# Patient Record
Sex: Female | Born: 1935 | Race: White | Hispanic: No | Smoking: Never smoker
Health system: Southern US, Community
[De-identification: ages and names within clinical notes are randomized; demographics above are authoritative.]

## PROBLEM LIST (undated history)

## (undated) DIAGNOSIS — I219 Acute myocardial infarction, unspecified: Secondary | ICD-10-CM

## (undated) DIAGNOSIS — E7800 Pure hypercholesterolemia, unspecified: Secondary | ICD-10-CM

## (undated) DIAGNOSIS — I639 Cerebral infarction, unspecified: Secondary | ICD-10-CM

## (undated) DIAGNOSIS — F419 Anxiety disorder, unspecified: Secondary | ICD-10-CM

## (undated) HISTORY — PX: CORONARY ARTERY BYPASS GRAFT: SHX141

## (undated) HISTORY — PX: BRAIN SURGERY: SHX531

---

## 2010-12-29 ENCOUNTER — Ambulatory Visit: Payer: Self-pay | Admitting: Internal Medicine

## 2012-02-10 ENCOUNTER — Emergency Department: Payer: Self-pay | Admitting: Emergency Medicine

## 2012-02-10 LAB — COMPREHENSIVE METABOLIC PANEL
Albumin: 3.6 g/dL (ref 3.4–5.0)
Anion Gap: 12 (ref 7–16)
BUN: 15 mg/dL (ref 7–18)
Chloride: 99 mmol/L (ref 98–107)
Co2: 24 mmol/L (ref 21–32)
Osmolality: 271 (ref 275–301)
Potassium: 4.3 mmol/L (ref 3.5–5.1)
SGOT(AST): 25 U/L (ref 15–37)
Total Protein: 7 g/dL (ref 6.4–8.2)

## 2012-02-10 LAB — CBC
HCT: 35.4 % (ref 35.0–47.0)
MCV: 93 fL (ref 80–100)
Platelet: 237 10*3/uL (ref 150–440)
RBC: 3.82 10*6/uL (ref 3.80–5.20)
RDW: 14.1 % (ref 11.5–14.5)
WBC: 8.8 10*3/uL (ref 3.6–11.0)

## 2012-03-06 ENCOUNTER — Ambulatory Visit: Payer: Self-pay | Admitting: Internal Medicine

## 2013-03-16 ENCOUNTER — Ambulatory Visit: Payer: Self-pay | Admitting: Family Medicine

## 2013-03-16 LAB — RAPID INFLUENZA A&B ANTIGENS

## 2013-04-02 ENCOUNTER — Ambulatory Visit: Payer: Self-pay | Admitting: Internal Medicine

## 2013-11-04 ENCOUNTER — Inpatient Hospital Stay: Payer: Self-pay | Admitting: Internal Medicine

## 2013-11-04 LAB — CK TOTAL AND CKMB (NOT AT ARMC)
CK, Total: 95 U/L (ref 21–215)
CK-MB: 4.7 ng/mL — ABNORMAL HIGH (ref 0.5–3.6)

## 2013-11-04 LAB — TROPONIN I
Troponin-I: 1.9 ng/mL — ABNORMAL HIGH
Troponin-I: 1.8 ng/mL — ABNORMAL HIGH

## 2013-11-04 LAB — CBC
HCT: 34.6 % — ABNORMAL LOW (ref 35.0–47.0)
HGB: 11.5 g/dL — ABNORMAL LOW (ref 12.0–16.0)
MCH: 30.1 pg (ref 26.0–34.0)
MCV: 91 fL (ref 80–100)
WBC: 6.6 10*3/uL (ref 3.6–11.0)

## 2013-11-04 LAB — BASIC METABOLIC PANEL
Anion Gap: 4 — ABNORMAL LOW (ref 7–16)
BUN: 11 mg/dL (ref 7–18)
Chloride: 101 mmol/L (ref 98–107)
Co2: 27 mmol/L (ref 21–32)
Creatinine: 1 mg/dL (ref 0.60–1.30)
EGFR (African American): >60
Glucose: 136 mg/dL — ABNORMAL HIGH (ref 65–99)
Potassium: 4.2 mmol/L (ref 3.5–5.1)
Sodium: 132 mmol/L — ABNORMAL LOW (ref 136–145)

## 2013-11-04 LAB — PRO B NATRIURETIC PEPTIDE: B-Type Natriuretic Peptide: 5813 pg/mL — ABNORMAL HIGH (ref 0–450)

## 2013-11-04 LAB — CK-MB: CK-MB: 5.6 ng/mL — ABNORMAL HIGH (ref 0.5–3.6)

## 2013-11-05 LAB — CBC WITH DIFFERENTIAL/PLATELET
Basophil #: 0.1 10*3/uL (ref 0.0–0.1)
Basophil %: 0.6 %
Eosinophil #: 0.2 10*3/uL (ref 0.0–0.7)
Eosinophil %: 1.9 %
HCT: 34.8 % — ABNORMAL LOW (ref 35.0–47.0)
HGB: 11.8 g/dL — ABNORMAL LOW (ref 12.0–16.0)
Lymphocyte #: 1.9 10*3/uL (ref 1.0–3.6)
Lymphocyte %: 21.9 %
MCH: 30.5 pg (ref 26.0–34.0)
Monocyte #: 1.3 x10 3/mm — ABNORMAL HIGH (ref 0.2–0.9)
Monocyte %: 15.1 %
Neutrophil #: 5.3 10*3/uL (ref 1.4–6.5)
Neutrophil %: 60.5 %
RDW: 13.9 % (ref 11.5–14.5)
WBC: 8.8 10*3/uL (ref 3.6–11.0)

## 2013-11-05 LAB — LIPID PANEL
HDL Cholesterol: 46 mg/dL (ref 40–60)
Triglycerides: 77 mg/dL (ref 0–200)

## 2013-11-05 LAB — BASIC METABOLIC PANEL
BUN: 12 mg/dL (ref 7–18)
Calcium, Total: 9.1 mg/dL (ref 8.5–10.1)
Co2: 26 mmol/L (ref 21–32)
Glucose: 99 mg/dL (ref 65–99)
Osmolality: 270 (ref 275–301)
Potassium: 3.8 mmol/L (ref 3.5–5.1)
Sodium: 135 mmol/L — ABNORMAL LOW (ref 136–145)

## 2013-11-05 LAB — TROPONIN I: Troponin-I: 1.7 ng/mL — ABNORMAL HIGH

## 2013-11-05 LAB — CK TOTAL AND CKMB (NOT AT ARMC)
CK, Total: 95 U/L (ref 21–215)
CK-MB: 4.4 ng/mL — ABNORMAL HIGH (ref 0.5–3.6)

## 2013-12-16 DIAGNOSIS — I2510 Atherosclerotic heart disease of native coronary artery without angina pectoris: Secondary | ICD-10-CM

## 2013-12-16 DIAGNOSIS — I6990 Unspecified sequelae of unspecified cerebrovascular disease: Secondary | ICD-10-CM

## 2013-12-16 DIAGNOSIS — I4891 Unspecified atrial fibrillation: Secondary | ICD-10-CM

## 2014-04-28 ENCOUNTER — Ambulatory Visit: Payer: Self-pay | Admitting: Internal Medicine

## 2014-12-02 ENCOUNTER — Telehealth: Payer: Self-pay | Admitting: Internal Medicine

## 2014-12-02 NOTE — Telephone Encounter (Signed)
Theotis BurrowJohn McCann at [employer] is calling asking that you call him back about patient.  He needs to discuss your role in treatment of patient at [facility] and if you have a claim with a Malpractice Carrier. [place] has [employer] and he has received a claim from this patient's attorney.  Please call.

## 2014-12-02 NOTE — Telephone Encounter (Signed)
I was expecting his call as Sela HuaPam Fox alerted me to expect it. I left a message and will try to call him back tomorrow between 1 & 2

## 2014-12-03 NOTE — Telephone Encounter (Signed)
Message left again.

## 2014-12-03 NOTE — Telephone Encounter (Signed)
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 There is allegation of delay in noting worsening of the wound and then hearing loss from amikacin that was subsequently needed

## 2014-12-04 NOTE — Telephone Encounter (Signed)
Risk management wants to know prescribed the antibiotic that they claim caused hearing loss?  Was her surgery done at [facility]?  We don't see this in Epic.

## 2014-12-05 NOTE — Telephone Encounter (Signed)
It was at [facility] and she was briefly at [facility]. They will need to review records as I don't recall all the specifics. I definitely never prescribed the amikacin that apparently was associated with the hearing loss as we never gave parenteral antibiotics at [facility]

## 2015-03-12 NOTE — Discharge Summary (Signed)
PATIENT NAME:  Holly York, Holly York MR#:  [id number] DATE OF BIRTH:  11-16-1936  DATE OF ADMISSION:  11/04/2013 DATE OF DISCHARGE:  11/05/2013  DISCHARGE DIAGNOSES: 1.  Non-ST-elevation myocardial infarction, multiple blockage as per cardiac cath, need for urgent coronary artery bypass graft.  2.  Acute systolic congestive heart failure, ejection fraction 30%.  3.  Hypertension.  4.  Hyperlipidemia.  5.  History of cerebral aneurysm and rupture causing bleed.  Last was in 2010.   CONDITION ON DISCHARGE:  Guarded.    CODE STATUS:  FULL CODE.   MEDICATIONS ON DISCHARGE: 1.  Citalopram 20 mg once a day.  2.  Simvastatin 40 mg once a day.  3.  Aspirin 81 mg once a day.  4.  Nitroglycerin 0.4 mg sublingual tablet as needed for chest pain.  5.  Furosemide 10 mg injectable every 12 hours.  6.  Pantoprazole 40 mg delayed-release once a day.   DIET ON DISCHARGE:  Low sodium, low fat, low cholesterol.  Diet consistency regular.   ACTIVITY:  Advise as no exertional activity.   COMMENTS:  The patient was being transferred to [facility] for urgent CABG, follow instructions as per cardiology at [facility] on discharge.  Advised to have follow-up with Dr. Adrian Blackwater.   HISTORY OF PRESENT ILLNESS:  The patient is a 79 year old female with past history of hypertension and chronic dry cough and hyperlipidemia, brain aneurysm and cerebral bleeding secondary to that with surgery twice in the past with some worsening of the cough for the last 2 to 3 days which was dry.  Also feeling some short of breath.  Because of constant coughing started feeling some chest pain in her ribs.  It was both sides and so she decided to come to Emergency Room.  Denied any palpitations or syncopal episode.  On further work-up she was found having elevated troponin and so admitted for further management.   HOSPITAL COURSE AND STAY:  Non-ST-elevation MI.  Because she had a history of cerebral bleed, she was not started on a heparin drip or  strong blood thinner, but just continued on aspirin and regular cardiac medication with telemetry monitoring and further follow-up of troponins.  Troponin remained high on further follow-up also and so cardiac cath was done the next day by Dr. Welton Flakes which showed multiple blockages and so he suggested to transfer the patient to [facility] for urgent coronary artery bypass graft surgery.   OTHER MEDICAL ISSUES: 1.  Acute systolic heart failure.  The patient's ejection fraction found to be 30% to 35%.  She was on IV Lasix and was getting comfortable.  2.  History of cerebral aneurysm bleed in the past so we did not give any strong anticoagulation.    CONSULTATIONS IN THE HOSPITAL:  Cardiology consult with Dr. Adrian Blackwater.   IMPORTANT LABORATORY RESULTS:  Chest x-ray, portable, single view  congestive heart failure.  Troponin was 1.9 on admission.  Creatinine was 1.  BNP was 5813.  D-dimer was 0.88.  CT of the angiogram pulmonary artery was done which showed extensive atherosclerosis, calcification aorta and coronary artery.  Troponin stayed stable 1.8 on further follow-up, 1.7 on third time follow-up.  Echocardiogram done which showed ejection fraction 30% to 35%, mild dilated left and right atrium, mild to moderate mitral valve regurgitation, mild aortic regurgitation.  Cardiac catheterization revealed ejection fraction 35%, critical left main with critical ostial disease in the LAD, left circumflex and right intermediate, 70% mid RCA and  apical hypokinesis,  proximal LAD was 99% stenosis.  Plan was transfer [facility] for urgent CABG.    Total time spent on this discharge 40 minutes.    ____________________________ Hope PigeonVaibhavkumar G. Elisabeth PigeonVachhani, MD vgv:ea D: 11/07/2013 22:52:07 ET T: 11/08/2013 07:02:18 ET JOB#: [id number]  cc: Hope PigeonVaibhavkumar G. Elisabeth PigeonVachhani, MD, <Dictator> Altamese DillingVAIBHAVKUMAR Quantina Dershem MD ELECTRONICALLY SIGNED 11/10/2013 14:31

## 2015-03-12 NOTE — Consult Note (Signed)
PATIENT NAME:  Holly York, Holly York MR#:  [id number] DATE OF BIRTH:  1936-05-05  DATE OF CONSULTATION:  11/05/2013  REFERRING PHYSICIAN:  Dr. Elisabeth PigeonVachhani  CONSULTING PHYSICIAN:  Verta EllenMonica A. Aurel Nguyen, PA-C and Adrian BlackwaterShaukat Khan, MD  PRIMARY CARE PHYSICIAN:  [facility].   REASON FOR CONSULTATION: Chest pain, elevated troponin.   HISTORY OF PRESENT ILLNESS: Ms. Holly York  is a 79 year old white female with a past medical history significant for hypertension, hyperlipidemia and cerebral artery aneurysm. The patient notes that yesterday she had increased shortness of breath, orthopnea, coughing and chest pain, which she thought was secondary to her cough. The patient normally only has to use two pillows to breathe, but yesterday she had to sleep sitting straight up. She does not have any chest pain currently and her history and physical is done while she is in echocardiogram room today.   PAST MEDICAL HISTORY: 1.  Hypertension.  2.  Hyperlipidemia.  3.  Cerebral artery aneurysm with cerebral bleeding x 2, once in 1988, second in 2010 status post surgery.   PAST SURGICAL HISTORY: Cerebral aneurysm surgery as mentioned above.   ALLERGIES: No known drug allergies.   HOME MEDICATIONS: 1.  Vitamin D3 one tablet p.o. daily. 2.  Vitamin B6 one tablet p.o. daily.  3.  Vitamin B12 once daily.  4.  Simvastatin 40 mg p.o. daily.  5.  Multivitamin 1 tablet p.o. daily.  6.  Fosamax 70 mg once weekly.  7.  Folic acid once daily.  8.  Citalopram 20 mg once daily.  9.  Carvedilol 12.5 mg 1 tablet p.o. b.i.d.   SOCIAL HISTORY: The patient lives with her family. She used to smoke but quit over 30 years ago. She drinks 1 or 2 beers every evening. She denies any illicit drug use.   FAMILY HISTORY:  Negative for coronary artery disease.   REVIEW OF SYSTEMS:  GENERAL: The patient denies any fever weight loss, weakness.  EYES: The patient denies any blurry vision, double vision.  EARS, NOSE AND  THROAT: The patient denies any tinnitus, ear pain, hearing loss.  RESPIRATORY:  The patient complains of increased shortness of breath and nonproductive cough.  CARDIOVASCULAR: The patient complains of chest pain after she started coughing. She has had increased orthopnea. Denies any palpitations or edema.  GASTROINTESTINAL: The patient denies any abdominal pain, nausea or vomiting.   PHYSICAL EXAMINATION: GENERAL: This is a pleasant female who is not in any acute distress. She is alert and oriented x 3.  VITAL SIGNS: Temperature 97.8 degrees Fahrenheit, heart rate is 72, respiratory rate 18, blood pressure 94/57, O2 sat is 91% on room air.  HEENT: Head atraumatic, normocephalic.  EYES: Pupils are round and equal. Conjunctivae are pink.  EARS AND NOSE: Normal to external inspection.  MOUTH: Moist mucous membranes.  NECK: Supple. Trachea is midline. There are no carotid bruits. There is no JVD.  LUNGS: The patient has some diminished breath sounds in the right lower lung base. There is no accessory muscle use.  CARDIOVASCULAR: Regular rate and rhythm. No murmurs, rubs, or gallops can be appreciated.  ABDOMEN: Nondistended. It is soft, nontender to palpation with bowel sounds present in all 4  quadrants.  EXTREMITIES: The patient has trace pedal edema bilaterally.   ANCILLARY DATA: EKG with normal sinus rhythm with nonspecific ST changes anteroseptally. Chest x-ray: Persistent findings of pulmonary edema and bibasilar atelectasis superimposed on advanced emphysematous change,  compression deformities involving T9 and L1  CT ANGIOGRAPHY: No evidence  of PE. Extensive arthrosclerotic disease. COPD changes with bibasilar small effusions and atelectasis.   LABORATORY DATA: Glucose 99, BUN 12, creatinine 1.04, sodium 135, potassium 3.8, chloride 101. Estimated GFR is greater than 60. LDL 63, VLDL 15, total cholesterol 409, triglycerides 77, HDL 46. Total CK 95, CK-MB 5.6 followed by 4.7, followed by  4.4. Troponin I 1.9, followed by 1.8, followed by 1.70. White blood cell count 8.8, hemoglobin 11.8, hematocrit 34.8, platelet count 214,000.   ASSESSMENT/PLAN: 1.  Chest pain and shortness of breath with elevated troponin I most likely consistent with non-ST elevation myocardial infarction. The patient denies any chest pain or shortness of breath at this time and we agree with continuing medical therapy with PPIs, with Zocor, aspirin and Lasix. Currently beta blockers are on hold for hypotension and heparin and has been held due to a history of a cerebral aneurysm. Echocardiogram has been ordered and pending results, we will proceed with cardiac catheterization later today with Dr. Adrian Blackwater. The risks and benefits of the procedure were discussed with the patient in detail and she is willing to proceed.   We will continue to follow this patient with you. Thank you very much for this consultation and allowing [place] to participate in this patient's care.    ____________________________ Verta Ellen, PA-C mam:dp D: 11/05/2013 09:18:48 ET T: 11/05/2013 09:57:39 ET JOB#: [id number]  cc: Verta Ellen, PA-C, <Dictator> [facility] Alivia Cimino A [facility] PA ELECTRONICALLY SIGNED 11/05/2013 21:37

## 2015-03-12 NOTE — H&P (Signed)
PATIENT NAME:  Holly York, Holly York MR#:  [id number] DATE OF BIRTH:  10-Jun-1936  DATE OF ADMISSION:  11/04/2013  PRIMARY CARE PHYSIC[facility] At [facility].  REFERRING EMERGENCY ROOM PHYSICIAN: Dr. Doug SouSam Jacubowitz.  CHIEF COMPLAINT: Chest pain and shortness of breath.   HISTORY OF PRESENT ILLNESS: The patient is a 79 year old female with past history of hypertension and chronic dry cough and hyperlipidemia with brain aneurysm and cerebral bleeding secondary to that and surgeries twice in the past. Had some worsening of her cough for the last 2 to 3 days, which is dry, and she is also feeling short of breath with that sometimes. Because of the constant coughing she started feeling chest pain, which is more on her ribs, lower side both sides, and so finally decided to come to the Emergency Room today. She said that today the pain is constant. On further questioning, she denies any palpitation or syncopal episodes. She denies any swelling in her legs. Denies any fever or sputum production. Her daughter had some bronchitis symptoms last week which resolved.  REVIEW OF SYSTEMS:    CONSTITUTIONAL: Negative for fever, fatigue, weakness, pain, or weight loss.  EYES: No blurring, double vision, discharge, or redness.  EARS, NOSE, THROAT: No tinnitus, ear pain, or hearing loss.  RESPIRATORY: The patient had some cough and shortness of breath, but no wheezing or sputum.  CARDIOVASCULAR: Has some chest pain secondary to constant cough. No orthopnea, edema, or palpitations.  GASTROINTESTINAL: No nausea, vomiting, diarrhea, abdominal pain.  GENITOURINARY: No dysuria, hematuria, or increased frequency.  ENDOCRINE: No increased sweating. No heat or cold intolerance.  SKIN: No acne, rashes, or lesions on the skin.  MUSCULOSKELETAL: No pain or swelling in the joints.  NEUROLOGICAL: No numbness, weakness, or change in the status. No tremor or vertigo.  PSYCHIATRIC: No anxiety, insomnia, bipolar disorder.    PAST MEDICAL HISTORY: Hypertension, hyperlipidemia, and cerebral artery aneurysms. Had ruptured aneurysm and cerebral bleeding twice, one in 1988 and [facility] [place], and the second one was in 2010 followed by surgery and then after the surgery, there was repeat angiogram done to check for the cerebral artery aneurysm which did not show any aneurysm in there.  PAST SURGICAL HISTORY: As mentioned above, cerebral aneurysm surgeries.   SOCIAL HISTORY: Lives with family. Nonsmoker since the last 30 years. Before that she was smoking. Drinks beer one time every evening. No illegal drug use.   FAMILY HISTORY: Negative for any significant medical problems.  MEDICATIONS AT HOME:   1.  Vitamin D3, 1 tablet once a day.  2.  Vitamin B6 once a day.  3.  Vitamin B12 once a day.  4.  Simvastatin 40 mg oral once a day.  5.  Multivitamin once a day.  6.  Fosamax 70 mg oral tablet once a week.  7.  Folic acid once a day.  8.  Citalopram 20 mg once a day.  9.  Carvedilol 12.5 mg 2 times a day.  PHYSICAL EXAMINATION: VITAL SIGNS: In ER, temperature 97.5, pulse 77, respirations 20, blood pressure 97/54, and pulse oximetry 95 on room air  GENERAL: The patient is fully alert and oriented to time, place, and person. Does not appear in any acute distress.  HEENT: Head and neck atraumatic. Conjunctivae pink. Oral mucosa moist.  NECK: Supple. No JVD.  RESPIRATORY: Bilateral clear and equal air entry.  CARDIOVASCULAR: S1, S2 present, regular. No murmur.  ABDOMEN: Soft, nontender. Bowel sounds present. No  organomegaly.  SKIN: No rashes.  EXTREMITIES: Legs: No edema.  NEUROLOGICAL: Some right upper limb weakness but otherwise, power 5 out of 5 in all other 3 limbs. Follows commands. No tremor or rigidity.  PSYCHIATRIC: Does not appear in any acute psychiatric illness.   IMPORTANT LABORATORY AND RADIOLOGICAL RESULTS: Glucose 136, BNP 5813, BUN 11, creatinine 1, sodium 132,  potassium 4.2, chloride 101, CO2 of 27, calcium 9.2. Troponin 1.90. WBC 6.6, hemoglobin 11.5, platelets 209. INR is 1. D-dimer is 0.88. CT scan of the chest with contrast for angiogram is done. It showed no evidence of pulmonary embolism, extensive atherosclerotic disease, COPD changes with bibasilar small effusion and atelectasis.   ASSESSMENT AND PLAN: A 79 year old female with history of hypertension, hyperlipidemia, and brain aneurysms and blockages and surgeries in the past, came with complaint of chest pain and shortness of breath with dry cough for the last 2 days. Pain is getting worse. Found having troponin 1.9, and CT scan shows diffuse atherosclerosis.  1.  Non-ST-elevation myocardial infarction: I spoke to Dr. Lady Gary about her being high risk for cerebral bleed and aneurysm history in the past, and he agreed not to start on any strong anticoagulation at this time, just give aspirin to her and give her cardiac medication as permitted including statin, beta blockers, and Lasix. She is not a candidate for beta blocker at this time because of blood pressure running slightly on the lower side, and I have to give Lasix because of her shortness of breath with elevated BNP, so I will avoid beta blocker at this time, give aspirin and statin. Will monitor on telemetry and follow serial troponins.  2.  Hypertension: Currently we will hold all the medication because we need to give Lasix, and blood pressure is running slightly on the lower normal side.  3.  Shortness of breath, elevated BNP: Congestive heart failure exacerbation or it is cardiomyopathy secondary to ischemia. We have to do echocardiogram to get further evaluation of this issue and cardiology consult with Dr. Lady Gary. We will give IV Lasix at this time.  4.  History of cerebral aneurysm and bleed in the past: She is high risk for that and so will avoid any strong anticoagulation at this time, including heparin or Lovenox, and will just monitor her  troponin. Dr. Lady Gary to decide about further planning from cardiac point of view tomorrow.   CODE STATUS: Full code.   TOTAL TIME SPENT: 50 minutes in admission of this patient.  ____________________________ Hope Pigeon. Elisabeth Pigeon, MD vgv:jcm D: 11/04/2013 20:26:42 ET T: 11/04/2013 21:53:22 ET JOB#: [id number]  cc: Hope Pigeon. Elisabeth Pigeon, MD, <Dictator> Altamese Dilling MD ELECTRONICALLY SIGNED 11/10/2013 14:26

## 2015-05-06 ENCOUNTER — Other Ambulatory Visit: Payer: Self-pay | Admitting: Internal Medicine

## 2015-05-06 DIAGNOSIS — Z1231 Encounter for screening mammogram for malignant neoplasm of breast: Secondary | ICD-10-CM

## 2015-05-28 ENCOUNTER — Emergency Department: Payer: Medicare Other

## 2015-05-28 ENCOUNTER — Observation Stay
Admission: EM | Admit: 2015-05-28 | Discharge: 2015-05-31 | Disposition: A | Discharge status: Home / Self Care | Payer: Medicare Other | Attending: Internal Medicine | Admitting: Internal Medicine

## 2015-05-28 ENCOUNTER — Encounter: Payer: Self-pay | Admitting: Emergency Medicine

## 2015-05-28 DIAGNOSIS — Z79899 Other long term (current) drug therapy: Secondary | ICD-10-CM | POA: Diagnosis not present

## 2015-05-28 DIAGNOSIS — E780 Pure hypercholesterolemia: Secondary | ICD-10-CM | POA: Insufficient documentation

## 2015-05-28 DIAGNOSIS — F419 Anxiety disorder, unspecified: Secondary | ICD-10-CM | POA: Insufficient documentation

## 2015-05-28 DIAGNOSIS — I252 Old myocardial infarction: Secondary | ICD-10-CM | POA: Insufficient documentation

## 2015-05-28 DIAGNOSIS — Z8673 Personal history of transient ischemic attack (TIA), and cerebral infarction without residual deficits: Secondary | ICD-10-CM | POA: Insufficient documentation

## 2015-05-28 DIAGNOSIS — E785 Hyperlipidemia, unspecified: Secondary | ICD-10-CM | POA: Insufficient documentation

## 2015-05-28 DIAGNOSIS — I2510 Atherosclerotic heart disease of native coronary artery without angina pectoris: Secondary | ICD-10-CM | POA: Diagnosis present

## 2015-05-28 DIAGNOSIS — S7002XA Contusion of left hip, initial encounter: Secondary | ICD-10-CM

## 2015-05-28 DIAGNOSIS — Z811 Family history of alcohol abuse and dependence: Secondary | ICD-10-CM | POA: Insufficient documentation

## 2015-05-28 DIAGNOSIS — D72829 Elevated white blood cell count, unspecified: Secondary | ICD-10-CM | POA: Diagnosis not present

## 2015-05-28 DIAGNOSIS — M25552 Pain in left hip: Secondary | ICD-10-CM | POA: Diagnosis not present

## 2015-05-28 DIAGNOSIS — Z951 Presence of aortocoronary bypass graft: Secondary | ICD-10-CM | POA: Insufficient documentation

## 2015-05-28 DIAGNOSIS — Z7982 Long term (current) use of aspirin: Secondary | ICD-10-CM | POA: Insufficient documentation

## 2015-05-28 DIAGNOSIS — Y92009 Unspecified place in unspecified non-institutional (private) residence as the place of occurrence of the external cause: Secondary | ICD-10-CM

## 2015-05-28 DIAGNOSIS — Z9889 Other specified postprocedural states: Secondary | ICD-10-CM | POA: Insufficient documentation

## 2015-05-28 DIAGNOSIS — Z8249 Family history of ischemic heart disease and other diseases of the circulatory system: Secondary | ICD-10-CM | POA: Diagnosis not present

## 2015-05-28 DIAGNOSIS — W19XXXA Unspecified fall, initial encounter: Secondary | ICD-10-CM

## 2015-05-28 HISTORY — DX: Cerebral infarction, unspecified: I63.9

## 2015-05-28 HISTORY — DX: Pure hypercholesterolemia, unspecified: E78.00

## 2015-05-28 HISTORY — DX: Acute myocardial infarction, unspecified: I21.9

## 2015-05-28 HISTORY — DX: Anxiety disorder, unspecified: F41.9

## 2015-05-28 LAB — COMPREHENSIVE METABOLIC PANEL
ALK PHOS: 57 U/L (ref 38–126)
ALT: 19 U/L (ref 14–54)
AST: 30 U/L (ref 15–41)
Albumin: 3.9 g/dL (ref 3.5–5.0)
Anion gap: 9 (ref 5–15)
BILIRUBIN TOTAL: 0.6 mg/dL (ref 0.3–1.2)
BUN: 19 mg/dL (ref 6–20)
CHLORIDE: 102 mmol/L (ref 101–111)
CO2: 25 mmol/L (ref 22–32)
CREATININE: 1.09 mg/dL — AB (ref 0.44–1.00)
Calcium: 9.1 mg/dL (ref 8.9–10.3)
GFR calc Af Amer: 55 mL/min — ABNORMAL LOW (ref >60)
GFR, EST NON AFRICAN AMERICAN: 48 mL/min — AB (ref >60)
GLUCOSE: 114 mg/dL — AB (ref 65–99)
POTASSIUM: 3.7 mmol/L (ref 3.5–5.1)
Sodium: 136 mmol/L (ref 135–145)
Total Protein: 7 g/dL (ref 6.5–8.1)

## 2015-05-28 LAB — CBC WITH DIFFERENTIAL/PLATELET
Basophils Absolute: 0 10*3/uL (ref 0–0.1)
Basophils Relative: 0 %
Eosinophils Absolute: 0.1 10*3/uL (ref 0–0.7)
Eosinophils Relative: 1 %
HEMATOCRIT: 36.9 % (ref 35.0–47.0)
Hemoglobin: 12.1 g/dL (ref 12.0–16.0)
LYMPHS PCT: 13 %
Lymphs Abs: 2 10*3/uL (ref 1.0–3.6)
MCH: 29.8 pg (ref 26.0–34.0)
MCHC: 32.8 g/dL (ref 32.0–36.0)
MCV: 90.9 fL (ref 80.0–100.0)
MONO ABS: 1.2 10*3/uL — AB (ref 0.2–0.9)
MONOS PCT: 7 %
NEUTROS ABS: 12.6 10*3/uL — AB (ref 1.4–6.5)
Neutrophils Relative %: 79 %
Platelets: 208 10*3/uL (ref 150–440)
RBC: 4.05 MIL/uL (ref 3.80–5.20)
RDW: 14.3 % (ref 11.5–14.5)
WBC: 16 10*3/uL — AB (ref 3.6–11.0)

## 2015-05-28 LAB — TYPE AND SCREEN
ABO/RH(D): B POS
Antibody Screen: NEGATIVE

## 2015-05-28 LAB — PROTIME-INR
INR: 1.01
PROTHROMBIN TIME: 13.5 s (ref 11.4–15.0)

## 2015-05-28 MED ORDER — ONDANSETRON HCL 4 MG/2ML IJ SOLN
INTRAMUSCULAR | Status: AC
  Filled 2015-05-28: qty 2

## 2015-05-28 MED ORDER — ONDANSETRON HCL 4 MG/2ML IJ SOLN
4.0000 mg | Freq: Once | INTRAMUSCULAR | Status: AC
  Administered 2015-05-28: 4 mg via INTRAVENOUS

## 2015-05-28 MED ORDER — MORPHINE SULFATE 4 MG/ML IJ SOLN
INTRAMUSCULAR | Status: AC
  Filled 2015-05-28: qty 1

## 2015-05-28 MED ORDER — MORPHINE SULFATE 4 MG/ML IJ SOLN
4.0000 mg | INTRAMUSCULAR | Status: DC | PRN
  Administered 2015-05-28: 4 mg via INTRAVENOUS

## 2015-05-28 NOTE — ED Notes (Signed)
Pt here by POV with c/o left hip pain; family reports pt fell today on left hip around 1800, family denies pt with any LOC. Pt unable to bear weight on left hip.

## 2015-05-28 NOTE — ED Notes (Signed)
Pt reports improved pain after morphine administration.

## 2015-05-28 NOTE — ED Provider Notes (Signed)
[facility] Emergency Department Provider Note  ____________________________________________  Time seen: On arrival  I have reviewed the triage vital signs and the nursing notes.   HISTORY  Chief Complaint Hip Pain and Fall   HPI Holly York is a 79 y.o. female who presents with left hip pain. Patient reports she fell approximately 6 PM today because she lost her balance and landed on her left hip. Apparently she was initially able to stand but the pain has become worse and she is no longer able to ambulate. She denies dizziness she denies chest pain. No fevers no chills. He denies other injuries     Past Medical History  Diagnosis Date  . Heart attack   . Stroke   . Hypercholesterolemia   . Anxiety     There are no active problems to display for this patient.   Past Surgical History  Procedure Laterality Date  . Coronary artery bypass graft    . Brain surgery      Current Outpatient Rx  Name  Route  Sig  Dispense  Refill  . acetaminophen (TYLENOL) 325 MG tablet   Oral   Take 650 mg by mouth every 4 (four) hours as needed for mild pain.         Marland Kitchen. aspirin 81 MG tablet   Oral   Take 81 mg by mouth daily.         . Cholecalciferol (VITAMIN D3) 5000 UNITS CAPS   Oral   Take by mouth daily.         . citalopram (CELEXA) 20 MG tablet   Oral   Take 20 mg by mouth daily.         . cyanocobalamin 1000 MCG tablet   Oral   Take 100 mcg by mouth daily.         . folic acid (FOLVITE) 800 MCG tablet   Oral   Take 400 mcg by mouth daily.         . Multiple Vitamins-Minerals (MULTIVITAMIN WITH MINERALS) tablet   Oral   Take 1 tablet by mouth daily.         Marland Kitchen. nystatin (MYCOSTATIN/NYSTOP) 100000 UNIT/GM POWD   Topical   Apply topically 2 (two) times daily.         Marland Kitchen. pyridOXINE (VITAMIN B-6) 100 MG tablet   Oral   Take 100 mg by mouth daily.         . simvastatin (ZOCOR) 40 MG tablet   Oral   Take 40 mg by  mouth daily.           Allergies Review of patient's allergies indicates no known allergies.  No family history on file.  Social History History  Substance Use Topics  . Smoking status: Never Smoker   . Smokeless tobacco: Not on file  . Alcohol Use: Not on file    Review of Systems  Constitutional: Negative for fever. Eyes: Negative for visual changes. ENT: Negative for sore throat Cardiovascular: Negative for chest pain. Respiratory: Negative for shortness of breath. Gastrointestinal: Negative for abdominal pain, vomiting and diarrhea. Genitourinary: Negative for dysuria. Musculoskeletal: Negative for back pain. Positive for left hip pain Skin: Negative for rash. Neurological: Negative for headaches or focal weakness Psychiatric: Positive for anxiety  10-point ROS otherwise negative.  ____________________________________________   PHYSICAL EXAM:  VITAL SIGNS: ED Triage Vitals  Enc Vitals Group     BP 05/28/15 2055 103/74 mmHg     Pulse Rate 05/28/15  2055 79     Resp 05/28/15 2055 22     Temp 05/28/15 2055 97.7 F (36.5 C)     Temp Source 05/28/15 2055 Oral     SpO2 05/28/15 2055 94 %     Weight 05/28/15 2055 172 lb (78.019 kg)     Height --      Head Cir --      Peak Flow --      Pain Score 05/28/15 2056 7     Pain Loc --      Pain Edu? --      Excl. in GC? --      Constitutional: Alert and oriented. Well appearing but in pain Eyes: Conjunctivae are normal.  ENT   Head: Normocephalic and atraumatic.   Mouth/Throat: Mucous membranes are moist. Cardiovascular: Normal rate, regular rhythm. Normal and symmetric distal pulses are present in all extremities. No murmurs, rubs, or gallops. Respiratory: Normal respiratory effort without tachypnea nor retractions. Breath sounds are clear and equal bilaterally.  Gastrointestinal: Soft and non-tender in all quadrants. No distention. There is no CVA tenderness. Genitourinary: deferred Musculoskeletal:  Patient has left hip flexed and is unwilling to straighten it, tenderness to palpation of the left hip. Distal pulses intact Neurologic:  Normal speech and language. No gross focal neurologic deficits are appreciated. Skin:  Skin is warm, dry and intact. No rash noted. Psychiatric: Mood and affect are normal. Patient exhibits appropriate insight and judgment.  ____________________________________________    LABS (pertinent positives/negatives)  Labs Reviewed  CBC WITH DIFFERENTIAL/PLATELET - Abnormal; Notable for the following:    WBC 16.0 (*)    Neutro Abs 12.6 (*)    Monocytes Absolute 1.2 (*)    All other components within normal limits  COMPREHENSIVE METABOLIC PANEL - Abnormal; Notable for the following:    Glucose, Bld 114 (*)    Creatinine, Ser 1.09 (*)    GFR calc non Af Amer 48 (*)    GFR calc Af Amer 55 (*)    All other components within normal limits  PROTIME-INR  URINALYSIS COMPLETEWITH MICROSCOPIC ([facility] ONLY)  TYPE AND SCREEN  ABO/RH    ____________________________________________   EKG  None  ____________________________________________    RADIOLOGY I have personally reviewed any xrays that were ordered on this patient: Hip x-rays: No fracture seen  ____________________________________________   PROCEDURES  Procedure(s) performed: none  Critical Care performed: none  ____________________________________________   INITIAL IMPRESSION / ASSESSMENT AND PLAN / ED COURSE  Pertinent labs & imaging results that were available during my care of the patient were reviewed by me and considered in my medical decision making (see chart for details).  Presentation is certainly concerning for hip fracture. We will give morphine 4 g IV and Zofran 4 mg IV and obtain x-rays  ----------------------------------------- 11:09 PM on 05/28/2015 -----------------------------------------  X-ray reassuring as no fracture seen. The patient is not able to ambulate  however it is not safe for discharge I will consult the hospitalist for admission.  ____________________________________________   FINAL CLINICAL IMPRESSION(S) / ED DIAGNOSES  Final diagnoses:  Contusion, hip, left, initial encounter  Fall as cause of accidental injury in home as place of occurrence     Jene Every, MD 05/28/15 2315

## 2015-05-29 ENCOUNTER — Encounter: Payer: Self-pay | Admitting: Internal Medicine

## 2015-05-29 DIAGNOSIS — M25552 Pain in left hip: Secondary | ICD-10-CM | POA: Diagnosis present

## 2015-05-29 DIAGNOSIS — I2510 Atherosclerotic heart disease of native coronary artery without angina pectoris: Secondary | ICD-10-CM | POA: Diagnosis present

## 2015-05-29 DIAGNOSIS — W19XXXA Unspecified fall, initial encounter: Secondary | ICD-10-CM

## 2015-05-29 DIAGNOSIS — Y92009 Unspecified place in unspecified non-institutional (private) residence as the place of occurrence of the external cause: Secondary | ICD-10-CM

## 2015-05-29 LAB — URINALYSIS COMPLETE WITH MICROSCOPIC (ARMC ONLY)
BILIRUBIN URINE: NEGATIVE
GLUCOSE, UA: NEGATIVE mg/dL
Hgb urine dipstick: NEGATIVE
Ketones, ur: NEGATIVE mg/dL
Nitrite: NEGATIVE
Protein, ur: NEGATIVE mg/dL
Specific Gravity, Urine: 1.004 — ABNORMAL LOW (ref 1.005–1.030)
pH: 7 (ref 5.0–8.0)

## 2015-05-29 LAB — CBC
HCT: 37.4 % (ref 35.0–47.0)
HEMOGLOBIN: 12.6 g/dL (ref 12.0–16.0)
MCH: 30.8 pg (ref 26.0–34.0)
MCHC: 33.7 g/dL (ref 32.0–36.0)
MCV: 91.2 fL (ref 80.0–100.0)
Platelets: 199 10*3/uL (ref 150–440)
RBC: 4.1 MIL/uL (ref 3.80–5.20)
RDW: 14.4 % (ref 11.5–14.5)
WBC: 10.1 10*3/uL (ref 3.6–11.0)

## 2015-05-29 LAB — ABO/RH: ABO/RH(D): B POS

## 2015-05-29 MED ORDER — SODIUM CHLORIDE 0.9 % IJ SOLN
3.0000 mL | Freq: Two times a day (BID) | INTRAMUSCULAR | Status: DC
  Administered 2015-05-29 – 2015-05-31 (×5): 3 mL via INTRAVENOUS

## 2015-05-29 MED ORDER — ADULT MULTIVITAMIN W/MINERALS CH
1.0000 | ORAL_TABLET | Freq: Every day | ORAL | Status: DC
  Administered 2015-05-29 – 2015-05-31 (×3): 1 via ORAL
  Filled 2015-05-29 (×5): qty 1

## 2015-05-29 MED ORDER — MORPHINE SULFATE 2 MG/ML IJ SOLN
2.0000 mg | INTRAMUSCULAR | Status: DC | PRN
  Administered 2015-05-29 – 2015-05-30 (×2): 2 mg via INTRAVENOUS
  Filled 2015-05-29 (×2): qty 1

## 2015-05-29 MED ORDER — VITAMIN B-6 50 MG PO TABS
100.0000 mg | ORAL_TABLET | Freq: Every day | ORAL | Status: DC
  Administered 2015-05-29 – 2015-05-31 (×3): 100 mg via ORAL
  Filled 2015-05-29 (×3): qty 1

## 2015-05-29 MED ORDER — HYDROCODONE-ACETAMINOPHEN 5-325 MG PO TABS
1.0000 | ORAL_TABLET | ORAL | Status: DC | PRN
  Administered 2015-05-29: 2 via ORAL
  Administered 2015-05-29 – 2015-05-30 (×2): 1 via ORAL
  Administered 2015-05-30 (×2): 2 via ORAL
  Administered 2015-05-31 (×3): 1 via ORAL
  Filled 2015-05-29: qty 2
  Filled 2015-05-29: qty 1
  Filled 2015-05-29: qty 2
  Filled 2015-05-29: qty 1
  Filled 2015-05-29: qty 2
  Filled 2015-05-29 (×3): qty 1

## 2015-05-29 MED ORDER — SODIUM CHLORIDE 0.9 % IJ SOLN
3.0000 mL | INTRAMUSCULAR | Status: DC | PRN
  Administered 2015-05-29: 3 mL via INTRAVENOUS
  Filled 2015-05-29: qty 10

## 2015-05-29 MED ORDER — VITAMIN B-12 100 MCG PO TABS
100.0000 ug | ORAL_TABLET | Freq: Every day | ORAL | Status: DC
  Administered 2015-05-29 – 2015-05-31 (×3): 100 ug via ORAL
  Filled 2015-05-29 (×3): qty 1

## 2015-05-29 MED ORDER — ENOXAPARIN SODIUM 40 MG/0.4ML ~~LOC~~ SOLN
40.0000 mg | SUBCUTANEOUS | Status: DC
  Administered 2015-05-29 – 2015-05-31 (×3): 40 mg via SUBCUTANEOUS
  Filled 2015-05-29 (×3): qty 0.4

## 2015-05-29 MED ORDER — SENNOSIDES-DOCUSATE SODIUM 8.6-50 MG PO TABS
1.0000 | ORAL_TABLET | Freq: Two times a day (BID) | ORAL | Status: DC
  Administered 2015-05-29 – 2015-05-31 (×5): 1 via ORAL
  Filled 2015-05-29 (×5): qty 1

## 2015-05-29 MED ORDER — CITALOPRAM HYDROBROMIDE 20 MG PO TABS
20.0000 mg | ORAL_TABLET | Freq: Every day | ORAL | Status: DC
  Administered 2015-05-29 – 2015-05-31 (×3): 20 mg via ORAL
  Filled 2015-05-29 (×3): qty 1

## 2015-05-29 MED ORDER — ASPIRIN EC 81 MG PO TBEC
81.0000 mg | DELAYED_RELEASE_TABLET | Freq: Every day | ORAL | Status: DC
  Administered 2015-05-29 – 2015-05-31 (×3): 81 mg via ORAL
  Filled 2015-05-29 (×5): qty 1

## 2015-05-29 MED ORDER — SODIUM CHLORIDE 0.9 % IV SOLN: 250.0000 mL | INTRAVENOUS | Status: DC | PRN

## 2015-05-29 MED ORDER — VITAMIN D3 25 MCG (1000 UNIT) PO TABS
5000.0000 [IU] | ORAL_TABLET | Freq: Every day | ORAL | Status: DC
  Administered 2015-05-29 – 2015-05-31 (×3): 5000 [IU] via ORAL
  Filled 2015-05-29 (×5): qty 5

## 2015-05-29 MED ORDER — FOLIC ACID 400 MCG PO TABS
800.0000 ug | ORAL_TABLET | Freq: Every day | ORAL | Status: DC
  Filled 2015-05-29: qty 2

## 2015-05-29 MED ORDER — SIMVASTATIN 40 MG PO TABS
40.0000 mg | ORAL_TABLET | Freq: Every day | ORAL | Status: DC
  Administered 2015-05-29 – 2015-05-31 (×3): 40 mg via ORAL
  Filled 2015-05-29 (×3): qty 1

## 2015-05-29 MED ORDER — FOLIC ACID 1 MG PO TABS
1.0000 mg | ORAL_TABLET | Freq: Every day | ORAL | Status: DC
  Administered 2015-05-29 – 2015-05-31 (×3): 1 mg via ORAL
  Filled 2015-05-29 (×3): qty 1

## 2015-05-29 MED ORDER — ACETAMINOPHEN 325 MG PO TABS: 650.0000 mg | ORAL_TABLET | ORAL | Status: DC | PRN

## 2015-05-29 MED ORDER — MAGNESIUM HYDROXIDE 400 MG/5ML PO SUSP
30.0000 mL | Freq: Two times a day (BID) | ORAL | Status: DC | PRN
  Administered 2015-05-31: 30 mL via ORAL
  Filled 2015-05-29: qty 30

## 2015-05-29 NOTE — Consult Note (Signed)
ORTHOPAEDIC CONSULTATION  REQUESTING PHYSICIAN: Ramonita LabAruna Gouru, MD  Chief Complaint: Left hip pain  HPI: Holly York is a 79 y.o. female who complains of  left hip pain. The patient sustained a mechanical fall last night and landed on her left hip and side. She was initially able to bear weight, but the pain increased in severity to the point that she was unable to stand and weight due to the left hip pain. She denied any numbness or radiation of pain down the leg. She denied any knee or lower leg symptoms. She denied any loss of consciousness. Prior to the fall she was ambulating without any ambulatory aids.  Past Medical History  Diagnosis Date  . Heart attack   . Stroke   . Hypercholesterolemia   . Anxiety    Past Surgical History  Procedure Laterality Date  . Coronary artery bypass graft    . Brain surgery     History   Social History  . Marital Status: Widowed    Spouse Name: N/A  . Number of Children: N/A  . Years of Education: N/A   Social History Main Topics  . Smoking status: Never Smoker   . Smokeless tobacco: Not on file  . Alcohol Use: Not on file  . Drug Use: No  . Sexual Activity: Not on file   Other Topics Concern  . None   Social History Narrative   Family History  Problem Relation Age of Onset  . Alcoholism Father   . Heart disease Brother    No Known Allergies Prior to Admission medications   Medication Sig Start Date End Date Taking? Authorizing Provider  acetaminophen (TYLENOL) 325 MG tablet Take 650 mg by mouth every 4 (four) hours as needed for mild pain.   Yes Historical Provider, MD  aspirin 81 MG tablet Take 81 mg by mouth daily.   Yes Historical Provider, MD  Cholecalciferol (VITAMIN D3) 5000 UNITS CAPS Take by mouth daily.   Yes Historical Provider, MD  citalopram (CELEXA) 20 MG tablet Take 20 mg by mouth daily.   Yes Historical Provider, MD  cyanocobalamin 1000 MCG tablet Take 100 mcg by mouth daily.   Yes Historical Provider, MD   folic acid (FOLVITE) 800 MCG tablet Take 400 mcg by mouth daily.   Yes Historical Provider, MD  Multiple Vitamins-Minerals (MULTIVITAMIN WITH MINERALS) tablet Take 1 tablet by mouth daily.   Yes Historical Provider, MD  nystatin (MYCOSTATIN/NYSTOP) 100000 UNIT/GM POWD Apply topically 2 (two) times daily.   Yes Historical Provider, MD  pyridOXINE (VITAMIN B-6) 100 MG tablet Take 100 mg by mouth daily.   Yes Historical Provider, MD  simvastatin (ZOCOR) 40 MG tablet Take 40 mg by mouth daily.   Yes Historical Provider, MD   Dg Hip Unilat With Pelvis 2-3 Views Left  05/29/2015   ADDENDUM REPORT: 05/29/2015 08:21  ADDENDUM: Original report by Dr. Si GaulHu.  Addendum by Dr Rito EhrlichKrishnan.  Subtle cortical irregularity of the left inferior pubic ramus, raising the possibility of nondisplaced inferior pubic ramus fracture.  Findings discussed with Dr. Milinda AntisJim Hooten on 05/29/2015 at 0758 hours.   Electronically Signed   By: Charline BillsSriyesh  Krishnan M.D.   On: 05/29/2015 08:21   05/29/2015   CLINICAL DATA:  Fall tonight with left hip pain.  Initial encounter.  EXAM: DG HIP (WITH OR WITHOUT PELVIS) 2-3V LEFT  COMPARISON:  None.  FINDINGS: There is no evidence of acute fracture, subluxation or dislocation.  Mild degenerative changes in the lower lumbar  spine and hips noted.  No focal bony lesions are present.  IMPRESSION: No evidence of acute bony abnormality.  Electronically Signed: By: Harmon Pier M.D. On: 05/28/2015 22:38    Positive ROS: All other systems have been reviewed and were otherwise negative with the exception of those mentioned in the HPI and as above.  Physical Exam: General: Alert and alert in no acute distress. HEENT: Atraumatic and normocephalic. Sclera are clear. Extraocular motion is intact. Oropharynx is clear with moist mucosa. The patient is hard of hearing. Neck: Supple, nontender, good range of motion.  Lungs: Clear to auscultation bilaterally. Cardiovascular: Regular rate and rhythm with normal S1 and S2.  No murmurs. No gallops or rubs. Pedal pulses are palpable bilaterally. Homans test is negative bilaterally. No significant pretibial or ankle edema. Abdomen: Soft, nontender, and nondistended. Bowel sounds are present. Skin: No lesions in the area of chief complaint Neurologic: Awake, alert, and oriented. Sensory function is grossly intact. Motor strength is felt to be 5 over 5 bilaterally. No clonus or tremor. Good motor coordination. Lymphatic: No axillary or cervical lymphadenopathy  MUSCULOSKELETAL: Examination of the left lower extremity shows no tenderness to palpation to the ankle or knee. No knee effusion. Good range of motion of the ankle and knee are appreciated. There is some point tenderness along the lateral aspect of the hip. The patient is able to perform heel slide with some assistance. Passive range of motion of the left hip is well-tolerated.  Assessment: Probable nondisplaced left inferior pubic ramus fracture  Plan: The findings were discussed in detail with the patient. Her pain seems to be under better control this morning. I would recommend a physical therapy consult to begin with that mobilization, transfers, and advanced to gait training as tolerated. I anticipate the need of a walker during her rehabilitation.  James P. Angie Fava M.D.

## 2015-05-29 NOTE — Progress Notes (Signed)
Physical Therapy Treatment Patient Details Name: Holly York MRN: [id number] DOB: Feb 15, 1936 Today's Date: 05/29/2015    History of Present Illness Pt had a fall with L hip pain, radiology found no fx, probable pubic ramus fx     PT Comments    Pt does better with AROM/resisted exercises this afternoon, reports minimal pain with limited pain increase with acts.   Follow Up Recommendations  SNF     Equipment Recommendations  Rolling walker with 5" wheels    Recommendations for Other Services       Precautions / Restrictions Precautions Precautions: Fall Restrictions Weight Bearing Restrictions: No    Mobility  Bed Mobility                  Transfers                    Ambulation/Gait                 Stairs            Wheelchair Mobility    Modified Rankin (Stroke Patients Only)       Balance                                    Cognition Arousal/Alertness: Awake/alert Behavior During Therapy: WFL for tasks assessed/performed                        Exercises General Exercises - Lower Extremity Ankle Circles/Pumps: AROM;10 reps Short Arc Quad: AROM;10 reps Heel Slides: AROM;Strengthening;10 reps Hip ABduction/ADduction: Strengthening;10 reps Straight Leg Raises: AROM;10 reps    General Comments        Pertinent Vitals/Pain Pain Assessment: 0-10 Pain Score: 1     Home Living                      Prior Function            PT Goals (current goals can now be found in the care plan section) Progress towards PT goals: Progressing toward goals    Frequency  Min 2X/week    PT Plan Current plan remains appropriate    Co-evaluation             End of Session Equipment Utilized During Treatment: Gait belt Activity Tolerance: Patient tolerated treatment well Patient left: with bed alarm set     Time: [id number] PT Time Calculation (min) (ACUTE ONLY): 14  min  Charges:  $Therapeutic Exercise: 8-22 mins                    G Codes:     Loran SentersGalen Pamila Mendibles, PT, DPT [phone]#[id number]  Malachi ProGalen R Anais Koenen 05/29/2015, 4:55 PM

## 2015-05-29 NOTE — Progress Notes (Signed)
Rounds by Dr Ernest PineHooten and Dr Hoyle SauerGuru , pt up in chair today, pt voids using bedpan, pt able to turn from side to side;

## 2015-05-29 NOTE — Progress Notes (Signed)
Pt is  2 plus assist to bedside commode pt slides left foot into position with great effort transferring from bed to bedside commode , pt became very unstable and took 3 assist to get her back to bed . Pt stated she could not move her left leg , iv morphine given for pain controll

## 2015-05-29 NOTE — ED Notes (Signed)
Pt assisted with bedpan, cms remains intact to all toes.

## 2015-05-29 NOTE — Progress Notes (Signed)
[facility] - [place] at [facility]   PATIENT NAME: Holly York    MR#:  [id number]  DATE OF BIRTH:  11-01-1936  SUBJECTIVE:  CHIEF COMPLAINT:  Patient is resting comfortably. Pain is manageable  REVIEW OF SYSTEMS:  CONSTITUTIONAL: No fever, fatigue or weakness.  EYES: No blurred or double vision.  EARS, NOSE, AND THROAT: No tinnitus or ear pain.  RESPIRATORY: No cough, shortness of breath, wheezing or hemoptysis.  CARDIOVASCULAR: No chest pain, orthopnea, edema.  GASTROINTESTINAL: No nausea, vomiting, diarrhea or abdominal pain.  GENITOURINARY: No dysuria, hematuria.  ENDOCRINE: No polyuria, nocturia,  HEMATOLOGY: No anemia, easy bruising or bleeding SKIN: No rash or lesion. MUSCULOSKELETAL: Left hip pain NEUROLOGIC: No tingling, numbness, weakness.  PSYCHIATRY: No anxiety or depression.   DRUG ALLERGIES:  No Known Allergies  VITALS:  Blood pressure 101/58, pulse 81, temperature 98.1 F (36.7 C), temperature source Oral, resp. rate 18, height  (1.549 m), weight 74.844 kg (165 lb), SpO2 91 %.  PHYSICAL EXAMINATION:  GENERAL:  79 y.o.-year-old patient lying in the bed with no acute distress.  EYES: Pupils equal, round, reactive to light and accommodation. No scleral icterus. Extraocular muscles intact.  HEENT: Head atraumatic, normocephalic. Oropharynx and nasopharynx clear.  NECK:  Supple, no jugular venous distention. No thyroid enlargement, no tenderness.  LUNGS: Normal breath sounds bilaterally, no wheezing, rales,rhonchi or crepitation. No use of accessory muscles of respiration.  CARDIOVASCULAR: S1, S2 normal. No murmurs, rubs, or gallops.  ABDOMEN: Soft, nontender, nondistended. Bowel sounds present. No organomegaly or mass.  EXTREMITIES: Left inguinal area is tender. No pedal edema, cyanosis, or clubbing.  NEUROLOGIC: Cranial nerves II through XII are intact. Muscle strength 5/5 in all extremities. Sensation intact. Gait not checked.   PSYCHIATRIC: The patient is alert and oriented x 3.  SKIN: No obvious rash, lesion, or ulcer.    LABORATORY PANEL:   CBC  Recent Labs Lab 05/29/15 0402  WBC 10.1  HGB 12.6  HCT 37.4  PLT 199   ------------------------------------------------------------------------------------------------------------------  Chemistries   Recent Labs Lab 05/28/15 2158  NA 136  K 3.7  CL 102  CO2 25  GLUCOSE 114*  BUN 19  CREATININE 1.09*  CALCIUM 9.1  AST 30  ALT 19  ALKPHOS 57  BILITOT 0.6   ------------------------------------------------------------------------------------------------------------------  Cardiac Enzymes No results for input(s): TROPONINI in the last 168 hours. ------------------------------------------------------------------------------------------------------------------  RADIOLOGY:  Dg Hip Unilat With Pelvis 2-3 Views Left  05/29/2015   ADDENDUM REPORT: 05/29/2015 08:21  ADDENDUM: Original report by Dr. Si Gaul.  Addendum by Dr Rito Ehrlich.  Subtle cortical irregularity of the left inferior pubic ramus, raising the possibility of nondisplaced inferior pubic ramus fracture.  Findings discussed with Dr. Milinda Antis on 05/29/2015 at 0758 hours.   Electronically Signed   By: Charline Bills M.D.   On: 05/29/2015 08:21   05/29/2015   CLINICAL DATA:  Fall tonight with left hip pain.  Initial encounter.  EXAM: DG HIP (WITH OR WITHOUT PELVIS) 2-3V LEFT  COMPARISON:  None.  FINDINGS: There is no evidence of acute fracture, subluxation or dislocation.  Mild degenerative changes in the lower lumbar spine and hips noted.  No focal bony lesions are present.  IMPRESSION: No evidence of acute bony abnormality.  Electronically Signed: By: Harmon Pier M.D. On: 05/28/2015 22:38    EKG:   Orders placed or performed during the hospital encounter of 05/28/15  . EKG 12-Lead  . EKG 12-Lead    ASSESSMENT AND PLAN:  Probable nondisplaced left inferior pubic ramus fracture  1. Left hip  pain following mechanical fall-  Patient not able to bear weight/ambulate.Probable nondisplaced left inferior pubic ramus fracture Pain management, physical therapy is recommending SNF. Follow up with case management regarding placement Appreciate orthopedic's recommendations 2. Leukocytosis, likely stress related. No fevers, patient not sick at home, exam unremarkable. Monitor, repeat CBC is trending down, follow-up accordingly. 3. History of coronary artery disease status post CABG, stable. No acute problems. Monitor. Obtain EKG for baseline. 4. Hyperlipidemia stable on statin, continue same. 5. History of GAD, stable on Celexa, continue same.      All the records are reviewed and case discussed with Care Management/Social Workerr. Management plans discussed with the patient, family and they are in agreement.  CODE STATUS: Full code  TOTAL TIME TAKING CARE OF THIS PATIENT: 35 minutes.   POSSIBLE D/C IN 1-2 DAYS, DEPENDING ON CLINICAL CONDITION.   Ramonita LabGouru, Kimika Streater M.D on 05/29/2015 at 2:39 PM  Between 7am to 6pm - Pager - [phone] After 6pm go to [email or web address] - password [email or web address] [facility]  [facility]  Office  [phone]  CC: Primary care physician; Mickey FarberHIES, DAVID, MD

## 2015-05-29 NOTE — Clinical Social Work Note (Signed)
Clinical Social Work Assessment  Patient Details  Name: Holly York MRN: [id number] Date of Birth: 01-18-1936  Date of referral:  05/29/15               Reason for consult:  Facility Placement                Permission sought to share information with:  Family Supports Permission granted to share information::  Yes, Verbal Permission Granted  Name::      (daughter)   Housing/Transportation Living arrangements for the past 2 months:  Single Family Home Source of Information:  Patient, Adult Children Patient Interpreter Needed:  None Criminal Activity/Legal Involvement Pertinent to Current Situation/Hospitalization:  No - Comment as needed Significant Relationships:  None Lives with:  Adult Children Do you feel safe going back to the place where you live?  No Need for family participation in patient care:  Yes (Comment)  Care giving concerns:  Pt is unsafe to return home as she is unable to safely ambulate at home. Pt does not have 24 hour supervision.    Social Worker assessment / plan:  CSW was notified by [facility] that pt needs SNF, however is Medicare OBS. CSW met with pt and daughter to address consult. CSW introduced herself and explained role of social work. CSW also explained the barriers that are present for SNF placement. Pt needs a 3 Night Inpatient Qualifying Stay for Medicare to cover STR at SNF. Pt is currently under OBS, therefore does not meet the criteria for SNF placement under Medicare guidelines. CSW offered other options that included private pay at a SNF or going home with home health and sitters. Pt's daughter stated that she is not able to pay privately, nor are they able to pay for caregivers. Pt's family is unable to provide adequate support, per pt's daughter due to health limitations and they are not home 24 hours a day.   Pt's daughter is very upset as pt is not able to return safely home at this time. CSW provided supportive counseling around this matter.    CSW updated RNCM and the possibility of sending the case up to EHR for review.   Employment status:  Retired Forensic scientist:  Medicare, Other (Comment Required) (Pt is OBSERVATION ) PT Recommendations:  [place] / Referral to community resources:  Other (Comment Required) (Medicare OBS info)  Patient/Family's Response to care:  Pt's daughter is very updated that pt is OBS and is unable to go to SNF at this time.   Patient/Family's Understanding of and Emotional Response to Diagnosis, Current Treatment, and Prognosis:  Pt's daughter would very much like for pt to go to SNF for STR as recommended by PT.  Emotional Assessment Appearance:  Appears stated age Attitude/Demeanor/Rapport:  Other (appropriate) Affect (typically observed):  Pleasant Orientation:  Oriented to Self, Oriented to Place, Oriented to  Time, Oriented to Situation Alcohol / Substance use:  Never Used Psych involvement (Current and /or in the community):  No (Comment)  Discharge Needs  Concerns to be addressed:  Home Safety Concerns, Financial / Insurance Concerns, Other (Comment Required (Unsafe discharge due to need for SNF. ) Readmission within the last 30 days:  No Current discharge risk:  Dependent with Mobility, Other (safety at home) Barriers to Discharge:  [place], LCSW 05/29/2015, 5:15 PM

## 2015-05-29 NOTE — Care Management Note (Signed)
Case Management Note  Patient Details  Name: Holly York MRN: [id number] Date of Birth: 28-Feb-1936  Subjective/Objective:       Discussed discharge planning with [name] and her daughter. [name] already has the following DME equipment at home: A rolling walker, BSC, cane, wheelchair.  No other DME needs at this time per the family's report.             Action/Plan:   Expected Discharge Date:                  Expected Discharge Plan:     In-House Referral:     Discharge planning Services     Post Acute Care Choice:    Choice offered to:     DME Arranged:    DME Agency:     HH Arranged:    HH Agency:     Status of Service:     Medicare Important Message Given:    Date Medicare IM Given:    Medicare IM give by:    Date Additional Medicare IM Given:    Additional Medicare Important Message give by:     If discussed at Long Length of Stay Meetings, dates discussed:    Additional Comments:  Tayen Narang A, RN 05/29/2015, 2:07 PM

## 2015-05-29 NOTE — ED Notes (Signed)
Pt assisted with bedpan. Pt states "i don't have as much pain as i did".

## 2015-05-29 NOTE — Evaluation (Signed)
Physical Therapy Evaluation Patient Details Name: Endya Austin MRN: [id number] DOB: [birth year] Today's Date: 05/29/2015   History of Present Illness  Pt had a fall with L hip pain, radiology found no fx   Clinical Impression  Pt had a fall at home, does not remember specifics.  No fx found with imaging, pt with moderate pain that does not increase significantly with minimal ambulation.  He is hesitant to trust the L LE however and fatigues very quickly with limited mobility.  She needs assist to get to sitting a agrees that she would not be safe to go home at this time.     Follow Up Recommendations SNF    Equipment Recommendations  Rolling walker with 5" wheels    Recommendations for Other Services       Precautions / Restrictions Precautions Precautions: Fall Restrictions Weight Bearing Restrictions: No      Mobility  Bed Mobility Overal bed mobility: Needs Assistance Bed Mobility: Supine to Sit     Supine to sit: Mod assist        Transfers Overall transfer level: Needs assistance Equipment used: Rolling walker (2 wheeled) Transfers: Sit to/from Stand Sit to Stand: Min assist         General transfer comment: Pt with some hesitancy standing (specifically L WBing), but does not report severe pain with standing  Ambulation/Gait Ambulation/Gait assistance: Min assist Ambulation Distance (Feet): 12 Feet Assistive device: Rolling walker (2 wheeled)       General Gait Details: Pt is able to take a few steps but becomes very fatigued and though she does not have signficant pain increase with standing she does have hesitancy   Stairs            Wheelchair Mobility    Modified Rankin (Stroke Patients Only)       Balance                                             Pertinent Vitals/Pain Pain Assessment: 0-10 Pain Score: 3     Home Living Family/patient expects to be discharged to::  (pt wants to go home, realizes that  is not safe at this time) Living Arrangements: Children                    Prior Function Level of Independence: Independent         Comments: Pt reports that she is normally able to be active w/o AD and needs only minimal help from daughter     Hand Dominance        Extremity/Trunk Assessment   Upper Extremity Assessment: Generalized weakness           Lower Extremity Assessment: Generalized weakness         Communication   Communication: No difficulties  Cognition Arousal/Alertness: Awake/alert Behavior During Therapy: WFL for tasks assessed/performed Overall Cognitive Status:  (some minimal confusion, unsure of baseline)                      General Comments      Exercises        Assessment/Plan    PT Assessment Patient needs continued PT services  PT Diagnosis Generalized weakness;Difficulty walking   PT Problem List Decreased strength;Decreased activity tolerance;Decreased balance;Decreased mobility;Decreased safety awareness  PT Treatment Interventions Gait training;Functional mobility training;Therapeutic activities;Therapeutic exercise  PT Goals (Current goals can be found in the Care Plan section) Acute Rehab PT Goals Patient Stated Goal: "I'd like to go home" PT Goal Formulation: With patient Time For Goal Achievement: 06/12/15 Potential to Achieve Goals: Good    Frequency Min 2X/week   Barriers to discharge        Co-evaluation               End of Session Equipment Utilized During Treatment: Gait belt Activity Tolerance: Patient limited by fatigue Patient left: with chair alarm set      Functional Assessment Tool Used: clinical judgement Functional Limitation: Mobility: Walking and moving around Mobility: Walking and Moving Around Current Status ([id number](G8978): At least 60 percent but less than 80 percent impaired, limited or restricted Mobility: Walking and Moving Around Goal Status [phone]([id number]): At least 20 percent  but less than 40 percent impaired, limited or restricted    Time: 0930-0950 PT Time Calculation (min) (ACUTE ONLY): 20 min   Charges:   PT Evaluation $Initial PT Evaluation Tier I: 1 Procedure     PT G Codes:   PT G-Codes **NOT FOR INPATIENT CLASS** Functional Assessment Tool Used: clinical judgement Functional Limitation: Mobility: Walking and moving around Mobility: Walking and Moving Around Current Status (W2956(G8978): At least 60 percent but less than 80 percent impaired, limited or restricted Mobility: Walking and Moving Around Goal Status [phone](G8979): At least 20 percent but less than 40 percent impaired, limited or restricted   Loran SentersGalen Brett Soza, PT, DPT [phone]  Malachi ProGalen R Kaytlyn Din 05/29/2015, 12:33 PM

## 2015-05-29 NOTE — Progress Notes (Signed)
[facility] stating that pt would need iv morphine for uncontrolled pain in order to change  pt to inpt status,  Suggested that pt be ambulated to and from nurses station inorder to evaluated pt need for iv medication for pain , therefore activity changed from bedrest to activity as tolerated . At present pt states pain is a 1.  So far one po pain med has controlled her pain

## 2015-05-29 NOTE — H&P (Signed)
[facility] Physicians - [place] at [facility]   PATIENT NAME: Holly York    MR#:  [id number]  DATE OF BIRTH:  1936-11-13  DATE OF ADMISSION:  05/28/2015  PRIMARY CARE PHYSICIAN: Mickey Farber, MD   REQUESTING/REFERRING PHYSICIAN: Jene Every  CHIEF COMPLAINT:   Chief Complaint  Patient presents with  . Hip Pain  . Fall    HISTORY OF PRESENT ILLNESS:  Holly York  is a 79 y.o. female with below mentioned*medical history presents to the emergency room with the complaints of left hip pain following a mechanical fall secondary to imbalance and tripping sustained around 6 PM at home. Patient was initially able to bear weight on the left lower extremity but as the pain worsened she could not stand, hence came to the emergency room for further evaluation. In the emergency room patient was evaluated by the ED physician, found to be with stable vital signs and x-ray of the left hip was reported negative for any acute fracture, dislocation. Patient was given IV pain medications following which her pain is under control but still not able to bear weight and ambulate hence hospitalist service was consulted for further management. Patient denies any history of chest pain, palpitations, dizziness, shortness of breath, loss of consciousness, recent fever, cough, nausea, vomiting, diarrhea, dysuria, abdominal pain. At the current time patient is comfortably resting in the bed and states her pain is under control.  PAST MEDICAL HISTORY:   Past Medical History  Diagnosis Date  . Heart attack   . Stroke   . Hypercholesterolemia   . Anxiety     PAST SURGICAL HISTORY:   Past Surgical History  Procedure Laterality Date  . Coronary artery bypass graft    . Brain surgery      SOCIAL HISTORY:   History  Substance Use Topics  . Smoking status: Never Smoker   . Smokeless tobacco: Not on file  . Alcohol Use: Not on file    FAMILY HISTORY:   Family History  Problem  Relation Age of Onset  . Alcoholism Father   . Heart disease Brother     DRUG ALLERGIES:  No Known Allergies  REVIEW OF SYSTEMS:   Review of Systems  Constitutional: Negative for fever, chills and malaise/fatigue.  HENT: Negative for ear pain, hearing loss, nosebleeds, sore throat and tinnitus.   Eyes: Negative for blurred vision, double vision, pain, discharge and redness.  Respiratory: Negative for cough, hemoptysis, sputum production, shortness of breath and wheezing.   Cardiovascular: Negative for chest pain, palpitations, orthopnea and leg swelling.  Gastrointestinal: Negative for nausea, vomiting, abdominal pain, diarrhea, constipation, blood in stool and melena.  Genitourinary: Negative for dysuria, urgency, frequency and hematuria.  Musculoskeletal: Positive for joint pain. Negative for back pain and neck pain.       Left hip pain following mechanical fall at home as noted in history of present illness. Patient not able to bear weight/ambulate.  Skin: Negative for itching and rash.  Neurological: Negative for dizziness, tingling, sensory change, focal weakness and seizures.  Endo/Heme/Allergies: Does not bruise/bleed easily.  Psychiatric/Behavioral: Negative for depression. The patient is not nervous/anxious.     MEDICATIONS AT HOME:   Prior to Admission medications   Medication Sig Start Date End Date Taking? Authorizing Provider  acetaminophen (TYLENOL) 325 MG tablet Take 650 mg by mouth every 4 (four) hours as needed for mild pain.   Yes Historical Provider, MD  aspirin 81 MG tablet Take 81 mg by mouth daily.  Yes Historical Provider, MD  Cholecalciferol (VITAMIN D3) 5000 UNITS CAPS Take by mouth daily.   Yes Historical Provider, MD  citalopram (CELEXA) 20 MG tablet Take 20 mg by mouth daily.   Yes Historical Provider, MD  cyanocobalamin 1000 MCG tablet Take 100 mcg by mouth daily.   Yes Historical Provider, MD  folic acid (FOLVITE) 800 MCG tablet Take 400 mcg by mouth  daily.   Yes Historical Provider, MD  Multiple Vitamins-Minerals (MULTIVITAMIN WITH MINERALS) tablet Take 1 tablet by mouth daily.   Yes Historical Provider, MD  nystatin (MYCOSTATIN/NYSTOP) 100000 UNIT/GM POWD Apply topically 2 (two) times daily.   Yes Historical Provider, MD  pyridOXINE (VITAMIN B-6) 100 MG tablet Take 100 mg by mouth daily.   Yes Historical Provider, MD  simvastatin (ZOCOR) 40 MG tablet Take 40 mg by mouth daily.   Yes Historical Provider, MD      VITAL SIGNS:  Blood pressure 156/75, pulse 78, temperature 97.7 F (36.5 C), temperature source Oral, resp. rate 22, weight 78.019 kg (172 lb), SpO2 95 %.  PHYSICAL EXAMINATION:  Physical Exam  Constitutional: She is oriented to person, place, and time. She appears well-developed and well-nourished. No distress.  HENT:  Head: Normocephalic and atraumatic.  Right Ear: External ear normal.  Left Ear: External ear normal.  Nose: Nose normal.  Mouth/Throat: Oropharynx is clear and moist. No oropharyngeal exudate.  Eyes: EOM are normal. Pupils are equal, round, and reactive to light. No scleral icterus.  Neck: Normal range of motion. Neck supple. No JVD present. No thyromegaly present.  Cardiovascular: Normal rate, regular rhythm, normal heart sounds and intact distal pulses.  Exam reveals no friction rub.   No murmur heard. Respiratory: Effort normal and breath sounds normal. No respiratory distress. She has no wheezes. She has no rales. She exhibits no tenderness.  GI: Soft. Bowel sounds are normal. She exhibits no distension and no mass. There is no tenderness. There is no rebound and no guarding.  Musculoskeletal: She exhibits tenderness. She exhibits no edema.  Tenderness + over left hip anteriorly. Decreased range of motion of left hip.  Lymphadenopathy:    She has no cervical adenopathy.  Neurological: She is alert and oriented to person, place, and time. She has normal reflexes. She displays normal reflexes. No cranial  nerve deficit. She exhibits normal muscle tone.  Skin: Skin is warm. No rash noted. No erythema.  Psychiatric: She has a normal mood and affect. Her behavior is normal. Thought content normal.   LABORATORY PANEL:   CBC  Recent Labs Lab 05/28/15 2158  WBC 16.0*  HGB 12.1  HCT 36.9  PLT 208   ------------------------------------------------------------------------------------------------------------------  Chemistries   Recent Labs Lab 05/28/15 2158  NA 136  K 3.7  CL 102  CO2 25  GLUCOSE 114*  BUN 19  CREATININE 1.09*  CALCIUM 9.1  AST 30  ALT 19  ALKPHOS 57  BILITOT 0.6   ------------------------------------------------------------------------------------------------------------------  Cardiac Enzymes No results for input(s): TROPONINI in the last 168 hours. ------------------------------------------------------------------------------------------------------------------  RADIOLOGY:  Dg Hip Unilat With Pelvis 2-3 Views Left  05/28/2015   CLINICAL DATA:  Fall tonight with left hip pain.  Initial encounter.  EXAM: DG HIP (WITH OR WITHOUT PELVIS) 2-3V LEFT  COMPARISON:  None.  FINDINGS: There is no evidence of acute fracture, subluxation or dislocation.  Mild degenerative changes in the lower lumbar spine and hips noted.  No focal bony lesions are present.  IMPRESSION: No evidence of acute bony abnormality.  Electronically Signed   By: Harmon PierJeffrey  Hu M.D.   On: 05/28/2015 22:38    EKG:   Orders placed or performed in visit on 11/04/13  . EKG 12-Lead  . EKG 12-Lead   Not done IMPRESSION AND PLAN:   1. Left hip pain following mechanical fall secondary to imbalance and tripping at home. X-ray left hip negative for acute fracture/dislocation. Patient not able to bear weight/ambulate. Plan: Admit to MedSurg for observation, pain control medications, bedrest. Request Ortho consultation for further advice. OT/PT consultation. 2. Leukocytosis, likely stress related. No  fevers, patient not sick at home, exam unremarkable. Monitor, repeat CBC in a.m., follow-up accordingly. 3. History of coronary artery disease status post CABG, stable. No acute problems. Monitor. Obtain EKG for baseline. 4. Hyperlipidemia stable on statin, continue same. 5. History of GAD, stable on Celexa, continue same.    All the records are reviewed and case discussed with ED provider. Management plans discussed with the patient, family and they are in agreement.  CODE STATUS: Full code  TOTAL TIME TAKING CARE OF THIS PATIENT: 50 minutes.    Jonnie KindEDDY, Waddell Iten N M.D on 05/29/2015 at 12:41 AM  Between 7am to 6pm - Pager - [phone]  After 6pm go to [email or web address] - password [email or web address] [facility]  [place] [facility] [place] [facility]  Office  [phone]  CC: Primary care physician; Mickey FarberHIES, DAVID, MD

## 2015-05-29 NOTE — Care Management Note (Signed)
Case Management Note  Patient Details  Name: Laray Angernna Mae Stender MRN: [id number] Date of Birth: 1936/07/29  Subjective/Objective:     Reviewed Medicare Observation Status Notification form with [name] and her daughter Cena BentonColleen Hursey ph: [phone]. Daughter stated to this writer that her Mother absolutely cannot go home until she is able to walk and move around unassisted for safety reasons to help prevent another fall. Both this case Production designer, theatre/television/filmmanager and Sarah CSW  Explained to daughter that Medicare will not pay for a patient to go directly into a SNF from a hospital Observation bed. [facility]-PT is recommending SNF. Discussed with [name] RN to please continue to document Mrs Fitzpatric's pain and pain medication given. This Clinical research associatewriter told daughter Jill SideColleen that she cannot promise anything but will discuss home safety issues around this discharge with Dr Amado CoeGouru tomorrow. [facility] to [facility] requesting that she re-evaluate for any possibly of change to Inpatient status.               Action/Plan:   Expected Discharge Date:                  Expected Discharge Plan:     In-House Referral:     Discharge planning Services     Post Acute Care Choice:    Choice offered to:     DME Arranged:    DME Agency:     HH Arranged:    HH Agency:     Status of Service:     Medicare Important Message Given:    Date Medicare IM Given:    Medicare IM give by:    Date Additional Medicare IM Given:    Additional Medicare Important Message give by:     If discussed at Long Length of Stay Meetings, dates discussed:    Additional Comments:  Branden Vine A, RN 05/29/2015, 5:17 PM

## 2015-05-30 NOTE — Care Management Note (Signed)
Case Management Note  Patient Details  Name: Holly York MRN: [id number] Date of Birth: Jan 17, 1936  Subjective/Objective:    Spoke with [name]'s daughter yesterday at length about daughter's insistence that she cannot safely manage [name] at home until she can ambulate and transfer herself independently. Explained that Medicare will not approve (pay for) [name] to go directly to a SNF from a hospital Observation bed. [name] is not requesting frequent pain medication but is unable to ambulate or transfer herself independently. Spoke with [name]'s nurse [name] and explained that [name] is not eligible for SNF and is in an Observation bed and is not requesting frequent pain medication. Suggested that staff increase [name]'s activity level so that the physician can determine from her resulting pain levels from increased activity when [name] can safely be discharged to her home.              Action/Plan:   Expected Discharge Date:                  Expected Discharge Plan:     In-House Referral:     Discharge planning Services     Post Acute Care Choice:    Choice offered to:     DME Arranged:    DME Agency:     HH Arranged:    HH Agency:     Status of Service:     Medicare Important Message Given:    Date Medicare IM Given:    Medicare IM give by:    Date Additional Medicare IM Given:    Additional Medicare Important Message give by:     If discussed at Long Length of Stay Meetings, dates discussed:    Additional Comments:  Najmo Pardue A, RN 05/30/2015, 8:31 AM

## 2015-05-30 NOTE — Progress Notes (Signed)
[facility] Physicians - [place] at [facility]   PATIENT NAME: Kinslie Hove    MR#:  [id number]  DATE OF BIRTH:  1935-12-22  SUBJECTIVE:  CHIEF COMPLAINT:  Patient is resting comfortably. Pain is manageable.  REVIEW OF SYSTEMS:  CONSTITUTIONAL: No fever, fatigue or weakness.  EYES: No blurred or double vision.  EARS, NOSE, AND THROAT: No tinnitus or ear pain.  RESPIRATORY: No cough, shortness of breath, wheezing or hemoptysis.  CARDIOVASCULAR: No chest pain, orthopnea, edema.  GASTROINTESTINAL: No nausea, vomiting, diarrhea or abdominal pain.  GENITOURINARY: No dysuria, hematuria.  ENDOCRINE: No polyuria, nocturia,  HEMATOLOGY: No anemia, easy bruising or bleeding SKIN: No rash or lesion. MUSCULOSKELETAL: Left hip pain NEUROLOGIC: No tingling, numbness, weakness.  PSYCHIATRY: No anxiety or depression.   DRUG ALLERGIES:  No Known Allergies  VITALS:  Blood pressure 104/47, pulse 89, temperature 97.6 F (36.4 C), temperature source Oral, resp. rate 18, height  (1.549 m), weight 75.206 kg (165 lb 12.8 oz), SpO2 89 %.  PHYSICAL EXAMINATION:  GENERAL:  79 y.o.-year-old patient lying in the bed with no acute distress.  EYES: Pupils equal, round, reactive to light and accommodation. No scleral icterus. Extraocular muscles intact.  HEENT: Head atraumatic, normocephalic. Oropharynx and nasopharynx clear.  NECK:  Supple, no jugular venous distention. No thyroid enlargement, no tenderness.  LUNGS: Normal breath sounds bilaterally, no wheezing, rales,rhonchi or crepitation. No use of accessory muscles of respiration.  CARDIOVASCULAR: S1, S2 normal. No murmurs, rubs, or gallops.  ABDOMEN: Soft, nontender, nondistended. Bowel sounds present. No organomegaly or mass.  EXTREMITIES: Left inguinal area is tender. No pedal edema, cyanosis, or clubbing.  NEUROLOGIC: Cranial nerves II through XII are intact. Muscle strength 5/5 in all extremities. Sensation intact. Gait not  checked.  PSYCHIATRIC: The patient is alert and oriented x 3.  SKIN: No obvious rash, lesion, or ulcer.    LABORATORY PANEL:   CBC  Recent Labs Lab 05/29/15 0402  WBC 10.1  HGB 12.6  HCT 37.4  PLT 199   ------------------------------------------------------------------------------------------------------------------  Chemistries   Recent Labs Lab 05/28/15 2158  NA 136  K 3.7  CL 102  CO2 25  GLUCOSE 114*  BUN 19  CREATININE 1.09*  CALCIUM 9.1  AST 30  ALT 19  ALKPHOS 57  BILITOT 0.6   ------------------------------------------------------------------------------------------------------------------  Cardiac Enzymes No results for input(s): TROPONINI in the last 168 hours. ------------------------------------------------------------------------------------------------------------------  RADIOLOGY:  Dg Hip Unilat With Pelvis 2-3 Views Left  05/29/2015   ADDENDUM REPORT: 05/29/2015 08:21  ADDENDUM: Original report by Dr. Si Gaul.  Addendum by Dr Rito Ehrlich.  Subtle cortical irregularity of the left inferior pubic ramus, raising the possibility of nondisplaced inferior pubic ramus fracture.  Findings discussed with Dr. Milinda Antis on 05/29/2015 at 0758 hours.   Electronically Signed   By: Charline Bills M.D.   On: 05/29/2015 08:21   05/29/2015   CLINICAL DATA:  Fall tonight with left hip pain.  Initial encounter.  EXAM: DG HIP (WITH OR WITHOUT PELVIS) 2-3V LEFT  COMPARISON:  None.  FINDINGS: There is no evidence of acute fracture, subluxation or dislocation.  Mild degenerative changes in the lower lumbar spine and hips noted.  No focal bony lesions are present.  IMPRESSION: No evidence of acute bony abnormality.  Electronically Signed: By: Harmon Pier M.D. On: 05/28/2015 22:38    EKG:   Orders placed or performed during the hospital encounter of 05/28/15  . EKG 12-Lead  . EKG 12-Lead    ASSESSMENT AND  PLAN:   Probable nondisplaced left inferior pubic ramus fracture  1.  Left hip pain following mechanical fall-   nondisplaced left inferior pubic ramus fracture Pain management, physical therapy is recommending SNF. Follow up with case management regarding placement Appreciate orthopedic's recommendations 2. Leukocytosis, likely stress related. No fevers, patient not sick at home, exam unremarkable. Monitor, repeat CBC is trending down, follow-up accordingly. 3. History of coronary artery disease status post CABG, stable. No acute problems. Monitor. Obtain EKG for baseline. 4. Hyperlipidemia stable on statin, continue same. 5. History of GAD, stable on Celexa, continue same.      All the records are reviewed and case discussed with Care Management/Social Workerr. Management plans discussed with the patient, family and they are in agreement.  CODE STATUS: Full code  TOTAL TIME TAKING CARE OF THIS PATIENT: 35 minutes.   POSSIBLE D/C IN 1-2 DAYS, DEPENDING ON CLINICAL CONDITION.   Ramonita LabGouru, Verenice Westrich M.D on 05/30/2015 at 4:58 PM  Between 7am to 6pm - Pager - [phone] After 6pm go to [email or web address] - password [email or web address] [facility]  [place] [place] [facility]  Office  [phone]  CC: Primary care physician; Mickey FarberHIES, DAVID, MD

## 2015-05-30 NOTE — Progress Notes (Signed)
Physical Therapy Treatment Patient Details Name: Holly York MRN: [id number] DOB: 07-10-1936 Today's Date: 05/30/2015    History of Present Illness Pt had a fall with L hip pain, radiology found no fx, probable pubic ramus fx     PT Comments    Pt continues to have some confusion and displays poor decision making/safety awareness during ambulation.  She becomes very fatigued with limited in room ambulation and struggles to get back to her recliner.  Pt very pleasant t/o PT, no increased pain with activity.    Follow Up Recommendations  SNF     Equipment Recommendations  Rolling walker with 5" wheels    Recommendations for Other Services       Precautions / Restrictions Precautions Precautions: Fall Restrictions Weight Bearing Restrictions: No    Mobility  Bed Mobility Overal bed mobility: Needs Assistance Bed Mobility: Supine to Sit     Supine to sit: Min assist        Transfers Overall transfer level: Needs assistance Equipment used: Rolling walker (2 wheeled) Transfers: Sit to/from Stand Sit to Stand: Min assist (cuing for set up, sequencing and general safety awareness)            Ambulation/Gait Ambulation/Gait assistance: Mod assist Ambulation Distance (Feet): 30 Feet Assistive device: Rolling walker (2 wheeled)     Gait velocity interpretation: <1.8 ft/sec, indicative of risk for recurrent falls General Gait Details: Pt again shows good effort, but becomes very fatigued with the effort.  Her vitals remains relatively stable but she is short of breath, sweating and struggles to even make it back to her recliner.     Stairs            Wheelchair Mobility    Modified Rankin (Stroke Patients Only)       Balance                                    Cognition Arousal/Alertness: Awake/alert (pt again with some confusion) Behavior During Therapy: Impulsive Overall Cognitive Status:  (pt continues to have some general  confusion)                      Exercises General Exercises - Lower Extremity Ankle Circles/Pumps: AROM;10 reps Quad Sets: 10 reps;Strengthening Short Arc Quad: AROM;10 reps Heel Slides: AROM;Strengthening;10 reps Hip ABduction/ADduction: Strengthening;10 reps    General Comments        Pertinent Vitals/Pain Pain Assessment: 0-10 Pain Score: 5     Home Living                      Prior Function            PT Goals (current goals can now be found in the care plan section) Progress towards PT goals: Progressing toward goals    Frequency  Min 2X/week    PT Plan Current plan remains appropriate    Co-evaluation             End of Session Equipment Utilized During Treatment: Gait belt Activity Tolerance: Patient limited by fatigue Patient left: with chair alarm set     Time: 1355-1420 PT Time Calculation (min) (ACUTE ONLY): 25 min  Charges:  $Gait Training: 8-22 mins $Therapeutic Exercise: 8-22 mins                    G Codes:  Loran SentersGalen Sota Hetz, PT, DPT [phone]  Malachi ProGalen R Livana Yerian 05/30/2015, 5:02 PM

## 2015-05-30 NOTE — Progress Notes (Signed)
PROGRESS NOTE  PATIENT NAME: Holly York DOB: Feb 15, 1936  MRN: [id number]  HD # 2: Left inferior pubic ramus fracture  Subjective: Patient reports pain as improved.   She reports less left hip pain this morning, although she did require IV morphine yesterday due to increased pain with mobilization. Notes from physical therapy were reviewed.  Objective: Vital signs in last 24 hours: Temp:  [98.1 F (36.7 C)-99 F (37.2 C)] 98.1 F (36.7 C) (07/10 0746) Pulse Rate:  [74-85] 74 (07/10 0749) Resp:  [18] 18 (07/10 0746) BP: (105-117)/(36-56) 105/36 mmHg (07/10 0749) SpO2:  [91 %-97 %] 91 % (07/10 0746) Weight:  [75.206 kg (165 lb 12.8 oz)] 75.206 kg (165 lb 12.8 oz) (07/10 0400)  Intake/Output from previous day: 07/09 0701 - 07/10 0700 In: 363 [P.O.:360; I.V.:3] Out: 1250 [Urine:1250]  EXAM General: Patient is Alert Left Lower Extremity: Mild tenderness to palpation about the left hip. Left hip range of motion is well-tolerated. The patient is able to perform straight leg raise and heel slide today with only mild discomfort. Neurologic: Awake and alert. She function is grossly intact. Motor strength is felt the 5/5 throughout with the exception of some guarding to the left hip musculature.  Assessment: Left inferior pubic ramus fracture  Plan: Continue with physical therapy. Continue with current pain medication regimen as needed. I would anticipate the need for rehabilitation at a skilled nursing facility.  James P. Hooten, Jr. M.D.  05/30/2015, 12:02 PM

## 2015-05-30 NOTE — Progress Notes (Signed)
Pt. Alert and oriented. VSS. Pt. C/o left hip and pelvic pain when turned this morning during her initial assessment. IV morphine given with relief. [facility] and was having difficulty moving left leg. When pt. Returned to bed, she requested something for pain. PO pain med given and pt. Rested til PT came to work with her. After sitting up in the chair for several hours pt. Returned to bed after using BSC. Pt. Resting quietly with daughter at the bedside. Hoping to go to short term rehab sometime next week. Will continue to monitor.

## 2015-05-31 MED ORDER — NYSTATIN 100000 UNIT/GM EX POWD: CUTANEOUS | Status: DC

## 2015-05-31 MED ORDER — MAGNESIUM HYDROXIDE 400 MG/5ML PO SUSP: 30.0000 mL | Freq: Two times a day (BID) | ORAL | Status: DC | PRN

## 2015-05-31 MED ORDER — HYDROCODONE-ACETAMINOPHEN 5-325 MG PO TABS: 1.0000 | ORAL_TABLET | ORAL | Status: DC | PRN

## 2015-05-31 MED ORDER — SENNOSIDES-DOCUSATE SODIUM 8.6-50 MG PO TABS: 1.0000 | ORAL_TABLET | Freq: Two times a day (BID) | ORAL | Status: DC

## 2015-05-31 NOTE — Discharge Instructions (Signed)
Follow-up with primary care physician in 3-5 days Continue physical therapy Follow-up with orthopedics in 1-2 weeks as needed Healthy heart diet Activity per physical therapy

## 2015-05-31 NOTE — Clinical Social Work Placement (Signed)
   CLINICAL SOCIAL WORK PLACEMENT  NOTE  Date:  05/31/2015  Patient Details  Name: Holly York MRN: [id number] Date of Birth: Jan 08, 1936  Clinical Social Work is seeking post-discharge placement for this patient at the Skilled  Nursing Facility level of care (*CSW will initial, date and re-position this form in  chart as items are completed):  Yes   Patient/family provided with [facility] Clinical Social Work Department's list of facilities offering this level of care within the geographic area requested by the patient (or if unable, by the patient's family).  Yes   Patient/family informed of their freedom to choose among providers that offer the needed level of care, that participate in Medicare, Medicaid or managed care program needed by the patient, have an available bed and are willing to accept the patient.  Yes   Patient/family informed of [facility]'s ownership interest in [facility] and [facility], as well as of the fact that they are under no obligation to receive care at these facilities.  PASRR submitted to EDS on       PASRR number received on       Existing PASRR number confirmed on 05/31/15     FL2 transmitted to all facilities in geographic area requested by pt/family on 05/31/15     FL2 transmitted to all facilities within larger geographic area on       Patient informed that his/her managed care company has contracts with or will negotiate with certain facilities, including the following:        Yes   Patient/family informed of bed offers received.  Patient chooses bed at  [facility]     Physician recommends and patient chooses bed at      Patient to be transferred to  [facility] on 05/31/15.  Patient to be transferred to facility by EMS     Patient family notified on 05/31/15 of transfer.  Name of family member notified:  Colleen     PHYSICIAN       Additional Comment:    _______________________________________________ Ned Cardara N  Nahzir Pohle, LCSW 05/31/2015, 1:40 PM

## 2015-05-31 NOTE — Care Management Note (Addendum)
Case Management Note  Patient Details  Name: Holly York MRN: [id number] Date of Birth: 1935/12/15  Subjective/Objective:                  Met with patient and spoke to her daughter Holly York [phone]. Patient lives with Holly York and her husband and her granddaughter. Per Holly York patient "is never alone for more than 8 hours a day but Holly York) states she is not physically able to care for her mother in the home". She states that she or her husband cannot afford to be out of work to take care of patient in the home. She states that she cannot afford SNF or private duty which I provided list of agencies for both. Patient has a wheelchair, rolling walker and bedside commode at the home. She has stairs to get into the home. Patient states she uses [facility]. She states she has lived in [place] for "a few years" with [place]. Patient is up to chair currently after working with PT. RNCM spoke with [facility] regarding continued care in the home and she became very upset stating she "was trembling with fear of taking her mother home and she cannot walk". She then begin to state that "it will be your fault if she goes home and sits in urine all day and night". I attempted to encourage her to come watch while patient worked with PT and attempt to arrange services in the home to meet patient needs- she said she'd be here with her husband around 2pm today.   Action/Plan: [name] CSW updated and working with patient/daughter on SNF under private pay.   Expected Discharge Date:                  Expected Discharge Plan:     In-House Referral:  Clinical Social Work  Discharge planning Services  CM Consult  Post Acute Care Choice:    Choice offered to:  Patient, Adult Children  DME Arranged:    DME Agency:     HH Arranged:    [place] Agency:     Status of Service:     Medicare Important Message Given:    Date Medicare IM Given:    Medicare IM give by:    Date Additional Medicare IM Given:    Additional  Medicare Important Message give by:     If discussed at [employer] of Stay Meetings, dates discussed:    Additional Comments:  [name], RN 05/31/2015, 9:59 AM

## 2015-05-31 NOTE — Discharge Summary (Signed)
[facility] [place] [facility]   PATIENT NAME: Holly York    MR#:  [id number]  DATE OF BIRTH:  Sep 01, 1936  DATE OF ADMISSION:  05/28/2015 ADMITTING PHYSICIAN: Crissie Figures, MD  DATE OF DISCHARGE: 05/31/2015 PRIMARY CARE PHYSICIAN: Mickey Farber, MD    ADMISSION DIAGNOSIS:  Contusion, hip, left, initial encounter [S70.02XA] Fall as cause of accidental injury in home as place of occurrence [W19.Lorne Skeens, Y92.009]  DISCHARGE DIAGNOSIS:  Principal Problem:   Left hip pain secondary to pubic ramus fracture Active Problems:   CAD (coronary artery disease)   Fall at home   SECONDARY DIAGNOSIS:   Past Medical History  Diagnosis Date  . Heart attack   . Stroke   . Hypercholesterolemia   . Anxiety     HOSPITAL COURSE:   1. Left hip pain following mechanical fall- nondisplaced left inferior pubic ramus fracture Pain management, physical therapy is recommending SNF. Pain management with [facility] Discharged to SNF Appreciate orthopedic's recommendations 2. Leukocytosis, likely stress related. No fevers, patient not sick at home, exam unremarkable. Monitor, repeat CBC is trending down 3. History of coronary artery disease status post CABG, stable. No acute problems. Monitor. Obtain EKG for baseline. 4. Hyperlipidemia stable on statin, continue same. 5. History of GAD, stable on Celexa, continue same.   DISCHARGE CONDITIONS:  Satisfactory    CONSULTS OBTAINED:  Treatment Team:  Donato Heinz, MD   PROCEDURES none  DRUG ALLERGIES:  No Known Allergies  DISCHARGE MEDICATIONS:   Current Discharge Medication List    CONTINUE these medications which have NOT CHANGED   Details  acetaminophen (TYLENOL) 325 MG tablet Take 650 mg by mouth every 4 (four) hours as needed for mild pain.    aspirin 81 MG tablet Take 81 mg by mouth daily.    Cholecalciferol (VITAMIN D3) 5000 UNITS CAPS Take by mouth daily.    citalopram (CELEXA) 20 MG  tablet Take 20 mg by mouth daily.    cyanocobalamin 1000 MCG tablet Take 100 mcg by mouth daily.    folic acid (FOLVITE) 800 MCG tablet Take 400 mcg by mouth daily.    Multiple Vitamins-Minerals (MULTIVITAMIN WITH MINERALS) tablet Take 1 tablet by mouth daily.    nystatin (MYCOSTATIN/NYSTOP) 100000 UNIT/GM POWD Apply topically 2 (two) times daily.    pyridOXINE (VITAMIN B-6) 100 MG tablet Take 100 mg by mouth daily.    simvastatin (ZOCOR) 40 MG tablet Take 40 mg by mouth daily.         DISCHARGE INSTRUCTIONS:    Follow-up with primary care physician 3-5 days   activity as recommended by physical therapy  DIET:  { Healthy heart  ACTIVITY:  {As recommended by physical therapy   OXYGEN:  Home Oxygen: No.   Oxygen Delivery:NO  DISCHARGE LOCATION:  SNF  If you experience worsening of your admission symptoms, develop shortness of breath, life threatening emergency, suicidal or homicidal thoughts you must seek medical attention immediately by calling 911 or calling your MD immediately  if symptoms less severe.  You Must read complete instructions/literature along with all the possible adverse reactions/side effects for all the Medicines you take and that have been prescribed to you. Take any new Medicines after you have completely understood and accpet all the possible adverse reactions/side effects.   Please note  You were cared for by a hospitalist during your hospital stay. If you have any questions about your discharge medications or the care you received while you were in the  hospital after you are discharged, you can call the unit and asked to speak with the hospitalist on call if the hospitalist that took care of you is not available. Once you are discharged, your primary care physician will handle any further medical issues. Please note that NO REFILLS for any discharge medications will be authorized once you are discharged, as it is imperative that you return to your  primary care physician (or establish a relationship with a primary care physician if you do not have one) for your aftercare needs so that they can reassess your need for medications and monitor your lab values.     Today  Chief Complaint  Patient presents with  . Hip Pain  . Fall   patient is resting comfortably. Pain is manageable   ROS: None  CONSTITUTIONAL: Denies fevers, chills. Denies any fatigue, weakness.  EYES: Denies blurry vision, double vision, eye pain. EARS, NOSE, THROAT: Denies tinnitus, ear pain, hearing loss. RESPIRATORY: Denies cough, wheeze, shortness of breath.  CARDIOVASCULAR: Denies chest pain, palpitations, edema.  GASTROINTESTINAL: Denies nausea, vomiting, diarrhea, abdominal pain. Denies bright red blood per rectum. GENITOURINARY: Denies dysuria, hematuria. ENDOCRINE: Denies nocturia or thyroid problems. HEMATOLOGIC AND LYMPHATIC: Denies easy bruising or bleeding. SKIN: Denies rash or lesion. MUSCULOSKELETAL: Denies pain in neck, back, shoulder, knees, hips or arthritic symptoms.  NEUROLOGIC: Denies paralysis, paresthesias.  PSYCHIATRIC: Denies anxiety or depressive symptoms.   VITAL SIGNS:  Blood pressure 104/46, pulse 75, temperature 98.4 F (36.9 C), temperature source Oral, resp. rate 18, height 5\' 1"  (1.549 m), weight 73.891 kg (162 lb 14.4 oz), SpO2 93 %.  I/O:   Intake/Output Summary (Last 24 hours) at 05/31/15 1322 Last data filed at 05/31/15 0933  Gross per 24 hour  Intake    360 ml  Output    875 ml  Net   -515 ml    PHYSICAL EXAMINATION:  GENERAL:  79 y.o.-year-old patient lying in the bed with no acute distress.  EYES: Pupils equal, round, reactive to light and accommodation. No scleral icterus. Extraocular muscles intact.  HEENT: Head atraumatic, normocephalic. Oropharynx and nasopharynx clear.  NECK:  Supple, no jugular venous distention. No thyroid enlargement, no tenderness.  LUNGS: Normal breath sounds bilaterally, no wheezing,  rales,rhonchi or crepitation. No use of accessory muscles of respiration.  CARDIOVASCULAR: S1, S2 normal. No murmurs, rubs, or gallops.  ABDOMEN: Soft, non-tender, non-distended. Bowel sounds present. No organomegaly or mass.  EXTREMITIES: No pedal edema, cyanosis, or clubbing.  NEUROLOGIC: Cranial nerves II through XII are intact. Sensation intact. Gait not checked.  PSYCHIATRIC: The patient is alert and oriented x 3.  SKIN: No obvious rash, lesion, or ulcer.   DATA REVIEW:   CBC  Recent Labs Lab 05/29/15 0402  WBC 10.1  HGB 12.6  HCT 37.4  PLT 199    Chemistries   Recent Labs Lab 05/28/15 2158  NA 136  K 3.7  CL 102  CO2 25  GLUCOSE 114*  BUN 19  CREATININE 1.09*  CALCIUM 9.1  AST 30  ALT 19  ALKPHOS 57  BILITOT 0.6    Cardiac Enzymes No results for input(s): TROPONINI in the last 168 hours.  Microbiology Results  Results for orders placed or performed in visit on 03/16/13  Influenza A&B Antigens [facility]     Status: None   Collection Time: 03/16/13 11:35 AM  Result Value Ref Range Status   Micro Text Report   Final       COMMENT  NEGATIVE FOR INFLUENZA A (ANTIGEN ABSENT)   COMMENT                   NEGATIVE FOR INFLUENZA B (ANTIGEN ABSENT)   ANTIBIOTIC                                                        RADIOLOGY:  Dg Hip Unilat With Pelvis 2-3 Views Left  05/29/2015   ADDENDUM REPORT: 05/29/2015 08:21  ADDENDUM: Original report by Dr. Si GaulHu.  Addendum by Dr Rito EhrlichKrishnan.  Subtle cortical irregularity of the left inferior pubic ramus, raising the possibility of nondisplaced inferior pubic ramus fracture.  Findings discussed with Dr. Milinda AntisJim Hooten on 05/29/2015 at 0758 hours.   Electronically Signed   By: Charline BillsSriyesh  Krishnan M.D.   On: 05/29/2015 08:21   05/29/2015   CLINICAL DATA:  Fall tonight with left hip pain.  Initial encounter.  EXAM: DG HIP (WITH OR WITHOUT PELVIS) 2-3V LEFT  COMPARISON:  None.  FINDINGS: There is no evidence of acute  fracture, subluxation or dislocation.  Mild degenerative changes in the lower lumbar spine and hips noted.  No focal bony lesions are present.  IMPRESSION: No evidence of acute bony abnormality.  Electronically Signed: By: Harmon PierJeffrey  Hu M.D. On: 05/28/2015 22:38    EKG:   Orders placed or performed during the hospital encounter of 05/28/15  . EKG 12-Lead  . EKG 12-Lead      Management plans discussed with the patient, family and they are in agreement.  CODE STATUS:     Code Status Orders        Start     Ordered   05/29/15 0207  Full code   Continuous     05/29/15 0206    Advance Directive Documentation        Most Recent Value   Type of Advance Directive  Healthcare Power of Attorney   Pre-existing out of facility DNR order (yellow form or pink MOST form)     "MOST" Form in Place?        TOTAL TIME TAKING CARE OF THIS PATIENT: 45minutes.    @[facility] @  on 05/31/2015 at 1:22 PM  Between 7am to 6pm - Pager - [phone]  After 6pm go to [email or web address] - password [email or web address] [facility]  [facility]  Office  [phone]  CC: Primary care physician; Mickey FarberHIES, DAVID, MD

## 2015-05-31 NOTE — Progress Notes (Signed)
CSW followed up with Pt and her daughter to discuss dc options. CSW discussed respite options with daughter at ALF and SNF. Pt's daughter has [facility].Pt is pleased with option as well.  CSW prepared dc packet and updated MD. Pt to dc to SNF today. RN to call report and EMS for transfer.   No further CSW needs at this time.   Holly Gristara Daralyn Bert, LCSW [phone]

## 2015-05-31 NOTE — Progress Notes (Addendum)
Physical Therapy Treatment Patient Details Name: Holly York MRN: [id number] DOB: 1936-04-27 Today's Date: 05/31/2015    History of Present Illness Pt had a fall with L hip pain, radiology found no fx, probable pubic ramus fx     PT Comments    Pt demonstrates slight improvement in mobility today. She remains significantly limited with pain in standing and with ambulation. Pt fatigues very quickly with ambulation and is unable to ambulate functional household distances. Pt is able to complete all exercises as instructed. Pt will benefit from skilled PT services to address deficits in strength, balance, and mobility in order to return to full function at home.    Follow Up Recommendations  SNF     Equipment Recommendations  Rolling walker with 5" wheels    Recommendations for Other Services       Precautions / Restrictions Precautions Precautions: Fall Restrictions Weight Bearing Restrictions: No    Mobility  Bed Mobility Overal bed mobility: Needs Assistance Bed Mobility: Supine to Sit     Supine to sit: Min assist     General bed mobility comments: Cues for sequencing. Assist with LLE and log rolling technique for ease of transfer  Transfers Overall transfer level: Needs assistance Equipment used: Rolling walker (2 wheeled) Transfers: Sit to/from Stand Sit to Stand: Min assist         General transfer comment: cuing for set up, sequencing and general safety awareness. Pt with decreased weight shift to LLE but improved from evaluation. Assist required to prevent LOB due to posterior leaning upon standing  Ambulation/Gait Ambulation/Gait assistance: Min assist Ambulation Distance (Feet): 30 Feet Assistive device: Rolling walker (2 wheeled)     Gait velocity interpretation: <1.8 ft/sec, indicative of risk for recurrent falls General Gait Details: Pt demonstrates good motivation with ambulation. Cues for increased RLE step length as it progressively  becomes shorter throughout session. Pt re-educated regarding walker sequencing. LLE buckling noted during ambulation and one bout of modA+1 due to buckling. Pt fatigues very quickly and returns return to recliner.   Stairs            Wheelchair Mobility    Modified Rankin (Stroke Patients Only)       Balance                                    Cognition Arousal/Alertness: Awake/alert Behavior During Therapy: WFL for tasks assessed/performed Overall Cognitive Status: No family/caregiver present to determine baseline cognitive functioning                      Exercises General Exercises - Lower Extremity Ankle Circles/Pumps: 10 reps;Strengthening;Both;Supine Quad Sets: 10 reps;Strengthening;Supine;Both Gluteal Sets: Strengthening;Both;10 reps;Supine Short Arc Quad: 10 reps;Strengthening;Both;Supine Long Arc Quad: Strengthening;Both;10 reps;Seated Heel Slides: Strengthening;10 reps;Both;Supine Hip ABduction/ADduction: Strengthening;10 reps;Both;Supine (10 reps also performed sitting) Straight Leg Raises: 10 reps;Strengthening;Both;Supine Hip Flexion/Marching: Strengthening;10 reps;Both;Seated    General Comments        Pertinent Vitals/Pain Pain Assessment: 0-10 Pain Score: 6  Pain Location: L hip Pain Intervention(s): Patient requesting pain meds-RN notified    Home Living                      Prior Function            PT Goals (current goals can now be found in the care plan section) Acute Rehab PT Goals Patient Stated Goal: "  I'd like to go home" PT Goal Formulation: With patient Time For Goal Achievement: 06/12/15 Potential to Achieve Goals: Good Progress towards PT goals: Progressing toward goals    Frequency  7X/week    PT Plan Current plan remains appropriate    Co-evaluation             End of Session Equipment Utilized During Treatment: Gait belt Activity Tolerance: Patient limited by fatigue Patient  left: with chair alarm set;with call bell/phone within reach;in chair;with nursing/sitter in room     Time: 0925-0948 PT Time Calculation (min) (ACUTE ONLY): 23 min  Charges:  $Gait Training: 8-22 mins $Therapeutic Exercise: 8-22 mins                    G Codes:      [name] PT, DPT   Huprich,Jason 05/31/2015, 10:39 AM

## 2016-06-01 ENCOUNTER — Emergency Department: Payer: Medicare Other

## 2016-06-01 ENCOUNTER — Encounter: Payer: Self-pay | Admitting: Emergency Medicine

## 2016-06-01 ENCOUNTER — Observation Stay
Admission: EM | Admit: 2016-06-01 | Discharge: 2016-06-03 | Disposition: A | Discharge status: Home / Self Care | Payer: Medicare Other | Attending: Internal Medicine | Admitting: Internal Medicine

## 2016-06-01 DIAGNOSIS — I252 Old myocardial infarction: Secondary | ICD-10-CM | POA: Diagnosis not present

## 2016-06-01 DIAGNOSIS — Z79899 Other long term (current) drug therapy: Secondary | ICD-10-CM | POA: Insufficient documentation

## 2016-06-01 DIAGNOSIS — Z8249 Family history of ischemic heart disease and other diseases of the circulatory system: Secondary | ICD-10-CM | POA: Diagnosis not present

## 2016-06-01 DIAGNOSIS — Z7982 Long term (current) use of aspirin: Secondary | ICD-10-CM | POA: Diagnosis not present

## 2016-06-01 DIAGNOSIS — M25552 Pain in left hip: Secondary | ICD-10-CM | POA: Diagnosis not present

## 2016-06-01 DIAGNOSIS — I2510 Atherosclerotic heart disease of native coronary artery without angina pectoris: Secondary | ICD-10-CM | POA: Diagnosis not present

## 2016-06-01 DIAGNOSIS — R531 Weakness: Secondary | ICD-10-CM | POA: Diagnosis not present

## 2016-06-01 DIAGNOSIS — W19XXXA Unspecified fall, initial encounter: Secondary | ICD-10-CM | POA: Diagnosis not present

## 2016-06-01 DIAGNOSIS — Z811 Family history of alcohol abuse and dependence: Secondary | ICD-10-CM | POA: Insufficient documentation

## 2016-06-01 DIAGNOSIS — Z8673 Personal history of transient ischemic attack (TIA), and cerebral infarction without residual deficits: Secondary | ICD-10-CM | POA: Insufficient documentation

## 2016-06-01 DIAGNOSIS — N390 Urinary tract infection, site not specified: Principal | ICD-10-CM | POA: Diagnosis present

## 2016-06-01 DIAGNOSIS — Z9889 Other specified postprocedural states: Secondary | ICD-10-CM | POA: Diagnosis not present

## 2016-06-01 DIAGNOSIS — Z951 Presence of aortocoronary bypass graft: Secondary | ICD-10-CM | POA: Diagnosis not present

## 2016-06-01 DIAGNOSIS — R296 Repeated falls: Secondary | ICD-10-CM | POA: Insufficient documentation

## 2016-06-01 DIAGNOSIS — Z66 Do not resuscitate: Secondary | ICD-10-CM | POA: Diagnosis not present

## 2016-06-01 DIAGNOSIS — G9341 Metabolic encephalopathy: Secondary | ICD-10-CM | POA: Insufficient documentation

## 2016-06-01 DIAGNOSIS — R4182 Altered mental status, unspecified: Secondary | ICD-10-CM

## 2016-06-01 DIAGNOSIS — E7800 Pure hypercholesterolemia, unspecified: Secondary | ICD-10-CM | POA: Insufficient documentation

## 2016-06-01 DIAGNOSIS — Z8679 Personal history of other diseases of the circulatory system: Secondary | ICD-10-CM | POA: Diagnosis not present

## 2016-06-01 DIAGNOSIS — F419 Anxiety disorder, unspecified: Secondary | ICD-10-CM | POA: Insufficient documentation

## 2016-06-01 DIAGNOSIS — G9389 Other specified disorders of brain: Secondary | ICD-10-CM | POA: Diagnosis not present

## 2016-06-01 LAB — URINALYSIS COMPLETE WITH MICROSCOPIC (ARMC ONLY)
Bilirubin Urine: NEGATIVE
Glucose, UA: NEGATIVE mg/dL
HGB URINE DIPSTICK: NEGATIVE
Ketones, ur: NEGATIVE mg/dL
Nitrite: NEGATIVE
Protein, ur: NEGATIVE mg/dL
Specific Gravity, Urine: 1.012 (ref 1.005–1.030)
pH: 7 (ref 5.0–8.0)

## 2016-06-01 LAB — COMPREHENSIVE METABOLIC PANEL
ALT: 15 U/L (ref 14–54)
ANION GAP: 9 (ref 5–15)
AST: 38 U/L (ref 15–41)
Albumin: 4 g/dL (ref 3.5–5.0)
Alkaline Phosphatase: 60 U/L (ref 38–126)
BUN: 19 mg/dL (ref 6–20)
CALCIUM: 9.1 mg/dL (ref 8.9–10.3)
CO2: 26 mmol/L (ref 22–32)
Chloride: 104 mmol/L (ref 101–111)
Creatinine, Ser: 0.85 mg/dL (ref 0.44–1.00)
GFR calc Af Amer: >60 mL/min (ref >60)
GFR calc non Af Amer: >60 mL/min (ref >60)
Glucose, Bld: 115 mg/dL — ABNORMAL HIGH (ref 65–99)
Potassium: 4.1 mmol/L (ref 3.5–5.1)
Sodium: 139 mmol/L (ref 135–145)
Total Bilirubin: 1.3 mg/dL — ABNORMAL HIGH (ref 0.3–1.2)
Total Protein: 7.3 g/dL (ref 6.5–8.1)

## 2016-06-01 LAB — CBC WITH DIFFERENTIAL/PLATELET
Basophils Absolute: 0 10*3/uL (ref 0–0.1)
Basophils Relative: 0 %
EOS ABS: 0 10*3/uL (ref 0–0.7)
Eosinophils Relative: 0 %
HEMATOCRIT: 42.5 % (ref 35.0–47.0)
HEMOGLOBIN: 13.9 g/dL (ref 12.0–16.0)
LYMPHS ABS: 1.5 10*3/uL (ref 1.0–3.6)
Lymphocytes Relative: 13 %
MCH: 29.8 pg (ref 26.0–34.0)
MCHC: 32.8 g/dL (ref 32.0–36.0)
MCV: 91 fL (ref 80.0–100.0)
MONOS PCT: 9 %
Monocytes Absolute: 1 10*3/uL — ABNORMAL HIGH (ref 0.2–0.9)
NEUTROS ABS: 9 10*3/uL — AB (ref 1.4–6.5)
NEUTROS PCT: 78 %
Platelets: 209 10*3/uL (ref 150–440)
RBC: 4.67 MIL/uL (ref 3.80–5.20)
RDW: 14.6 % — ABNORMAL HIGH (ref 11.5–14.5)
WBC: 11.5 10*3/uL — ABNORMAL HIGH (ref 3.6–11.0)

## 2016-06-01 LAB — TROPONIN I: Troponin I: <0.03 ng/mL (ref <0.03)

## 2016-06-01 MED ORDER — ACETAMINOPHEN 650 MG RE SUPP: 650.0000 mg | Freq: Four times a day (QID) | RECTAL | Status: DC | PRN

## 2016-06-01 MED ORDER — MORPHINE SULFATE (PF) 2 MG/ML IV SOLN: 1.0000 mg | INTRAVENOUS | Status: DC | PRN

## 2016-06-01 MED ORDER — ENOXAPARIN SODIUM 40 MG/0.4ML ~~LOC~~ SOLN
40.0000 mg | SUBCUTANEOUS | Status: DC
  Administered 2016-06-02: 40 mg via SUBCUTANEOUS
  Filled 2016-06-01: qty 0.4

## 2016-06-01 MED ORDER — ACETAMINOPHEN 325 MG PO TABS: 650.0000 mg | ORAL_TABLET | Freq: Four times a day (QID) | ORAL | Status: DC | PRN

## 2016-06-01 MED ORDER — OXYCODONE HCL 5 MG PO TABS
5.0000 mg | ORAL_TABLET | ORAL | Status: DC | PRN
  Administered 2016-06-03: 5 mg via ORAL
  Filled 2016-06-01: qty 1

## 2016-06-01 MED ORDER — CITALOPRAM HYDROBROMIDE 20 MG PO TABS
20.0000 mg | ORAL_TABLET | Freq: Every day | ORAL | Status: DC
  Administered 2016-06-02 – 2016-06-03 (×2): 20 mg via ORAL
  Filled 2016-06-01 (×2): qty 1

## 2016-06-01 MED ORDER — ADULT MULTIVITAMIN W/MINERALS CH
1.0000 | ORAL_TABLET | Freq: Every day | ORAL | Status: DC
  Administered 2016-06-02 – 2016-06-03 (×2): 1 via ORAL
  Filled 2016-06-01 (×2): qty 1

## 2016-06-01 MED ORDER — ONDANSETRON HCL 4 MG PO TABS: 4.0000 mg | ORAL_TABLET | Freq: Four times a day (QID) | ORAL | Status: DC | PRN

## 2016-06-01 MED ORDER — VITAMIN B-6 50 MG PO TABS
100.0000 mg | ORAL_TABLET | Freq: Every day | ORAL | Status: DC
  Administered 2016-06-02 – 2016-06-03 (×2): 100 mg via ORAL
  Filled 2016-06-01: qty 2
  Filled 2016-06-01 (×2): qty 1

## 2016-06-01 MED ORDER — SODIUM CHLORIDE 0.9 % IV SOLN
Freq: Once | INTRAVENOUS | Status: AC
  Administered 2016-06-01: 18:00:00 via INTRAVENOUS

## 2016-06-01 MED ORDER — DEXTROSE 5 % IV SOLN
1.0000 g | Freq: Once | INTRAVENOUS | Status: AC
  Administered 2016-06-01: 1 g via INTRAVENOUS
  Filled 2016-06-01: qty 10

## 2016-06-01 MED ORDER — VITAMIN D 1000 UNITS PO TABS
5000.0000 [IU] | ORAL_TABLET | Freq: Every day | ORAL | Status: DC
  Administered 2016-06-02 – 2016-06-03 (×2): 5000 [IU] via ORAL
  Filled 2016-06-01 (×2): qty 5

## 2016-06-01 MED ORDER — VITAMIN B-12 100 MCG PO TABS
100.0000 ug | ORAL_TABLET | Freq: Every day | ORAL | Status: DC
  Administered 2016-06-02 – 2016-06-03 (×2): 100 ug via ORAL
  Filled 2016-06-01 (×2): qty 1

## 2016-06-01 MED ORDER — ASPIRIN EC 81 MG PO TBEC
81.0000 mg | DELAYED_RELEASE_TABLET | Freq: Every day | ORAL | Status: DC
  Administered 2016-06-01 – 2016-06-03 (×3): 81 mg via ORAL
  Filled 2016-06-01 (×3): qty 1

## 2016-06-01 MED ORDER — SIMVASTATIN 40 MG PO TABS
40.0000 mg | ORAL_TABLET | Freq: Every day | ORAL | Status: DC
  Administered 2016-06-02 – 2016-06-03 (×2): 40 mg via ORAL
  Filled 2016-06-01 (×2): qty 1

## 2016-06-01 MED ORDER — ONDANSETRON HCL 4 MG/2ML IJ SOLN: 4.0000 mg | Freq: Four times a day (QID) | INTRAMUSCULAR | Status: DC | PRN

## 2016-06-01 MED ORDER — DOCUSATE SODIUM 100 MG PO CAPS
100.0000 mg | ORAL_CAPSULE | Freq: Two times a day (BID) | ORAL | Status: DC
Start: 2016-06-01 — End: 2016-06-03
  Administered 2016-06-02 – 2016-06-03 (×3): 100 mg via ORAL
  Filled 2016-06-01 (×3): qty 1

## 2016-06-01 MED ORDER — FOLIC ACID 1 MG PO TABS
1000.0000 ug | ORAL_TABLET | Freq: Every day | ORAL | Status: DC
  Administered 2016-06-02 – 2016-06-03 (×2): 1 mg via ORAL
  Filled 2016-06-01 (×2): qty 1

## 2016-06-01 MED ORDER — DEXTROSE 5 % IV SOLN
1.0000 g | INTRAVENOUS | Status: DC
  Administered 2016-06-02: 1 g via INTRAVENOUS
  Filled 2016-06-01 (×2): qty 10

## 2016-06-01 NOTE — ED Notes (Signed)
Attempted to call report x 1  

## 2016-06-01 NOTE — H&P (Signed)
[facility] [place] at [facility]   PATIENT NAME: Holly York    MR#:  [id number]  DATE OF BIRTH:  Sep 01, 1936  DATE OF ADMISSION:  06/01/2016  PRIMARY CARE PHYSICIAN: Mickey Farber, MD   REQUESTING/REFERRING PHYSICIAN: Dr Shaune Pollack  CHIEF COMPLAINT:  Left hip pain after fall today at home  HISTORY OF PRESENT ILLNESS:  Holly York  is a 80 y.o. female with a known history of Brain aneurysm status post surgery, stroke, history of coronary artery disease status post CABG comes to the emergency room brought in by family after she had 2 falls this morning at home which appeared to be mechanical without any trauma. Patient complains of left hip pain. She lives with her daughter and family felt patient was little off her usual self being and seemed to be confused. In the emergency room patient CT head did not show any acute changes. Chest x-ray was negative. Her left hip x-ray was ordered by me which was also still pending. She was found to have UTI. Given her confusion and altered mental status and UTI and left hip pain she is being admitted for further evaluation and management. She received a dose of IV Rocephin. When asked patient about dysuria frequency or urgency she is denying any other symptoms.  PAST MEDICAL HISTORY:   Past Medical History  Diagnosis Date  . Heart attack (HCC)   . Stroke (HCC)   . Hypercholesterolemia   . Anxiety     PAST SURGICAL HISTOIRY:   Past Surgical History  Procedure Laterality Date  . Coronary artery bypass graft    . Brain surgery      SOCIAL HISTORY:   Social History  Substance Use Topics  . Smoking status: Never Smoker   . Smokeless tobacco: Not on file  . Alcohol Use: Not on file    FAMILY HISTORY:   Family History  Problem Relation Age of Onset  . Alcoholism Father   . Heart disease Brother     DRUG ALLERGIES:  No Known Allergies  REVIEW OF SYSTEMS:  Review of Systems  Constitutional: Negative  for fever, chills and weight loss.  HENT: Negative for ear discharge, ear pain and nosebleeds.   Eyes: Negative for blurred vision, pain and discharge.  Respiratory: Negative for sputum production, shortness of breath, wheezing and stridor.   Cardiovascular: Negative for chest pain, palpitations, orthopnea and PND.  Gastrointestinal: Negative for nausea, vomiting, abdominal pain and diarrhea.  Genitourinary: Negative for urgency and frequency.  Musculoskeletal: Positive for joint pain and falls. Negative for back pain.  Neurological: Positive for weakness. Negative for sensory change, speech change and focal weakness.  Psychiatric/Behavioral: Negative for depression and hallucinations. The patient is not nervous/anxious.   All other systems reviewed and are negative.    MEDICATIONS AT HOME:   Prior to Admission medications   Medication Sig Start Date End Date Taking? Authorizing Provider  aspirin 81 MG tablet Take 81 mg by mouth daily.   Yes Historical Provider, MD  Cholecalciferol (VITAMIN D3) 5000 UNITS CAPS Take by mouth daily.   Yes Historical Provider, MD  citalopram (CELEXA) 20 MG tablet Take 20 mg by mouth daily.   Yes Historical Provider, MD  cyanocobalamin 1000 MCG tablet Take 100 mcg by mouth daily.   Yes Historical Provider, MD  folic acid (FOLVITE) 800 MCG tablet Take 400 mcg by mouth daily.   Yes Historical Provider, MD  Multiple Vitamins-Minerals (MULTIVITAMIN WITH MINERALS) tablet Take 1 tablet by mouth  daily.   Yes Historical Provider, MD  pyridOXINE (VITAMIN B-6) 100 MG tablet Take 100 mg by mouth daily.   Yes Historical Provider, MD  simvastatin (ZOCOR) 40 MG tablet Take 40 mg by mouth daily.   Yes Historical Provider, MD      VITAL SIGNS:  Blood pressure 146/77, pulse 75, temperature 98.1 F (36.7 C), temperature source Oral, resp. rate 27, height 5\' 2"  (1.575 m), weight 77.111 kg (170 lb), SpO2 96 %.  PHYSICAL EXAMINATION:  GENERAL:  80 y.o.-year-old patient lying  in the bed with no acute distress.  EYES: Pupils equal, round, reactive to light and accommodation. No scleral icterus. Extraocular muscles intact.  HEENT: Head atraumatic, normocephalic. Oropharynx and nasopharynx clear.  NECK:  Supple, no jugular venous distention. No thyroid enlargement, no tenderness.  LUNGS: Normal breath sounds bilaterally, no wheezing, rales,rhonchi or crepitation. No use of accessory muscles of respiration.  CARDIOVASCULAR: S1, S2 normal. No murmurs, rubs, or gallops.  ABDOMEN: Soft, nontender, nondistended. Bowel sounds present. No organomegaly or mass.  EXTREMITIES: No pedal edema, cyanosis, or clubbing.  NEUROLOGIC: Cranial nerves II through XII are intact. Muscle strength 5/5 in all extremities. Sensation intact. Gait not checked.  PSYCHIATRIC: The patient is alert and oriented x 2.She has some issues with memory and was not able to articulate her events according to her disease however she answered all questions appropriately. SKIN: No obvious rash, lesion, or ulcer.   LABORATORY PANEL:   CBC  Recent Labs Lab 06/01/16 1304  WBC 11.5*  HGB 13.9  HCT 42.5  PLT 209   ------------------------------------------------------------------------------------------------------------------  Chemistries   Recent Labs Lab 06/01/16 1304  NA 139  K 4.1  CL 104  CO2 26  GLUCOSE 115*  BUN 19  CREATININE 0.85  CALCIUM 9.1  AST 38  ALT 15  ALKPHOS 60  BILITOT 1.3*   ------------------------------------------------------------------------------------------------------------------  Cardiac Enzymes  Recent Labs Lab 06/01/16 1304  TROPONINI <0.03   ------------------------------------------------------------------------------------------------------------------  RADIOLOGY:  Ct Head Wo Contrast  06/01/2016  CLINICAL DATA:  Recent falls and increasing confusion EXAM: CT HEAD WITHOUT CONTRAST TECHNIQUE: Contiguous axial images were obtained from the base of  the skull through the vertex without intravenous contrast. COMPARISON:  02/10/2012 FINDINGS: Bony calvarium is intact. There are changes consistent with prior embolization as well as prior aneurysm clipping along the falx anteriorly. A ventriculostomy catheter is again seen on the right. Frontal encephalomalacia changes are again noted and stable. No findings to suggest acute hemorrhage, acute infarction or space-occupying mass lesion are noted. 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 IMPRESSION: Chronic ischemic changes and prior surgical changes which are stable from the prior exam. No acute abnormality noted. Electronically Signed   By: Alcide CleverMark  Lukens M.D.   On: 06/01/2016 13:41   Dg Chest Port 1 View  06/01/2016  CLINICAL DATA:  80 year old female with confusion, cough and falls. EXAM: PORTABLE CHEST 1 VIEW COMPARISON:  11/05/2013 FINDINGS: Cardiomegaly and CABG changes again noted. Bibasilar atelectasis/ scarring again noted. There is no evidence of focal airspace disease, pulmonary edema, suspicious pulmonary nodule/mass, pleural effusion, or pneumothorax. No acute bony abnormalities are identified. IMPRESSION: Cardiomegaly without evidence of acute cardiopulmonary disease. Electronically Signed   By: Harmon PierJeffrey  Hu M.D.   On: 06/01/2016 13:27    EKG:    IMPRESSION AND PLAN:   Holly York  is a 80 y.o. female with a known history  of Brain aneurysm status post surgery, stroke, history of coronary artery disease status post CABG comes to the emergency room brought in by family after she had 2 falls this morning at home which appeared to be mechanical without any trauma. Patient complains of left hip pain. She lives with her daughter and family felt patient was little off her usual self being and seemed to be confused  1. UTI -Admit to medical floor -IV Rocephin -Follow up blood culture urine  culture - change to oral antibiotics according to culture and sensitivities  2. Left hip pain after fall today.  -X-ray left pelvic and hip pending  When necessary meds  -Physical therapy to see patient  3. Hyperlipidemia -simvastatin  4. DVT prophylaxis lovenox SQ   All the records are reviewed and case discussed with ED provider. Management plans discussed with the patient, family and they are in agreement.  CODE STATUS: DNR (confirmed with dter)  TOTAL TIME TAKING CARE OF THIS PATIENT: .    Holly York M.D on 06/01/2016 at 3:14 PM  Between 7am to 6pm - Pager - [phone]  After 6pm go to [email or web address] - password [email or web address] [facility]  [facility]  Office  [phone]  CC: Primary care physician; Mickey Farber, MD

## 2016-06-01 NOTE — ED Notes (Addendum)
Attempting to call report x2

## 2016-06-01 NOTE — ED Notes (Signed)
Per [facility], patient comes from home due to fall. Patient fell yesterday and again last night. Patient was having pain in her left hip, went to move in bed to get more comfortable and fell out of bed. Hx of left hip hairline fracture. Patient denies pain at this time. Family states patient has been more confused over the past couple days. Patient denies LOC or hitting head. Patient A&O x4. EMS states patient seemed confused by repeating questions and comments over and over.

## 2016-06-01 NOTE — ED Provider Notes (Signed)
[facility] Emergency Department Provider Note   ____________________________________________  Time seen: Approximately 12:30pm I have reviewed the triage vital signs and the triage nursing note.  HISTORY  Chief Complaint Fall   Historian Patient's daughter with whom she lives  HPI Holly York is a 80 y.o. female with a history of 2 prior brain ruptured aneurysms, prior stroke, prior heart attack and CHF without ever having been on Lasix, is brought here by her daughter for mild confusion and possibly off balance for the past 2 days. She has had 2 falls. She was found in her room on carpet and is complaining of some left hip discomfort, where she has previously had a fracture. This morning she fell out of her bed. She has not been complaining of a headache. She has a chronic hacking cough without any change. No fevers. No abdominal pain. No vomiting or diarrhea.  No neck pain, the only injury reported possibly from the falls is the left hip discomfort.  This patient is really unable to stand on her own due to the pain and weakness at this point, and family cannot take care of her right now.    Past Medical History  Diagnosis Date  . Heart attack (HCC)   . Stroke (HCC)   . Hypercholesterolemia   . Anxiety     Patient Active Problem List   Diagnosis Date Noted  . Left hip pain 05/29/2015  . CAD (coronary artery disease) 05/29/2015  . Fall at home 05/29/2015    Past Surgical History  Procedure Laterality Date  . Coronary artery bypass graft    . Brain surgery      Current Outpatient Rx  Name  Route  Sig  Dispense  Refill  . aspirin 81 MG tablet   Oral   Take 81 mg by mouth daily.         . Cholecalciferol (VITAMIN D3) 5000 UNITS CAPS   Oral   Take by mouth daily.         . citalopram (CELEXA) 20 MG tablet   Oral   Take 20 mg by mouth daily.         . cyanocobalamin 1000 MCG tablet   Oral   Take 100 mcg by mouth daily.          . folic acid (FOLVITE) 800 MCG tablet   Oral   Take 400 mcg by mouth daily.         . Multiple Vitamins-Minerals (MULTIVITAMIN WITH MINERALS) tablet   Oral   Take 1 tablet by mouth daily.         Marland Kitchen. pyridOXINE (VITAMIN B-6) 100 MG tablet   Oral   Take 100 mg by mouth daily.         . simvastatin (ZOCOR) 40 MG tablet   Oral   Take 40 mg by mouth daily.           Allergies Review of patient's allergies indicates no known allergies.  Family History  Problem Relation Age of Onset  . Alcoholism Father   . Heart disease Brother     Social History Social History  Substance Use Topics  . Smoking status: Never Smoker   . Smokeless tobacco: None  . Alcohol Use: None    Review of Systems  Constitutional: Negative for fever. Eyes: Negative for visual changes. ENT: Negative for sore throat. Cardiovascular: Negative for chest pain. Respiratory: Negative for shortness of breath. Gastrointestinal: Negative for abdominal pain, vomiting and  diarrhea. Genitourinary: Negative for dysuria. Musculoskeletal: Negative for back pain. Skin: Negative for rash. Neurological: Negative for headache. 10 point Review of Systems otherwise negative ____________________________________________   PHYSICAL EXAM:  VITAL SIGNS: ED Triage Vitals  Enc Vitals Group     BP 06/01/16 1120 142/48 mmHg     Pulse Rate 06/01/16 1120 79     Resp 06/01/16 1120 18     Temp 06/01/16 1120 98.1 F (36.7 C)     Temp Source 06/01/16 1120 Oral     SpO2 06/01/16 1120 95 %     Weight 06/01/16 1120 170 lb (77.111 kg)     Height 06/01/16 1120 5\' 2"  (1.575 m)     Head Cir --      Peak Flow --      Pain Score --      Pain Loc --      Pain Edu? --      Excl. in GC? --      Constitutional: Alert And cooperative but poor historian and not oriented. Well appearing overall and in no distress. HEENT   Head: Normocephalic and atraumatic.      Eyes: Conjunctivae are normal. PERRL. Normal  extraocular movements.      Ears:         Nose: No congestion/rhinnorhea.   Mouth/Throat: Mucous membranes are moist.   Neck: No stridor. Cardiovascular/Chest: Normal rate, regular rhythm.  No murmurs, rubs, or gallops. Respiratory: Normal respiratory effort without tachypnea nor retractions. Breath sounds are clear and equal bilaterally. No wheezes/rales/rhonchi. Gastrointestinal: Soft. No distention, no guarding, no rebound. Nontender.    Genitourinary/rectal:Deferred Musculoskeletal: Pelvis stable. Mild left hip pain, not shortened or externally rotated.  2+LE edema bilaterally Neurologic:  No slurred speech. No facial droop. No gross or focal neurologic deficits are appreciated. Somewhat generalized weakness all over. Skin:  Skin is warm, dry and intact. No rash noted. Psychiatric: No hallucinations.  ____________________________________________   EKG I, Governor Rooks, MD, the attending physician have personally viewed and interpreted all ECGs.  74 beats per minute. Nonspecific intraventricular conduction delay. Normal axis. Nonspecific ST and T-wave ____________________________________________  LABS (pertinent positives/negatives)  Labs Reviewed  URINALYSIS COMPLETEWITH MICROSCOPIC ([facility] ONLY) - Abnormal; Notable for the following:    Color, Urine YELLOW (*)    APPearance CLOUDY (*)    Leukocytes, UA 3+ (*)    Bacteria, UA FEW (*)    Squamous Epithelial / LPF 0-5 (*)    All other components within normal limits  COMPREHENSIVE METABOLIC PANEL - Abnormal; Notable for the following:    Glucose, Bld 115 (*)    Total Bilirubin 1.3 (*)    All other components within normal limits  CBC WITH DIFFERENTIAL/PLATELET - Abnormal; Notable for the following:    WBC 11.5 (*)    RDW 14.6 (*)    Neutro Abs 9.0 (*)    Monocytes Absolute 1.0 (*)    All other components within normal limits  URINE CULTURE  TROPONIN I     ____________________________________________  RADIOLOGY All Xrays were viewed by me. Imaging interpreted by Radiologist.  CT head without contrast: Chronic ischemic changes and prior surgical changes which are stable from the prior exam. No acute on [place] noted.  Chest portable: IMPRESSION: Cardiomegaly without evidence of acute cardiopulmonary disease. __________________________________________  PROCEDURES  Procedure(s) performed: None  Critical Care performed: None  ____________________________________________   ED COURSE / ASSESSMENT AND PLAN  Pertinent labs & imaging results that were available during my care of the  patient were reviewed by me and considered in my medical decision making (see chart for details).   This patient has had multiple falls with past 2 days and she is now too weak to walk and family is unable to care for her.  She's had numerous cardiac issues, as well as brain aneurysms and stroke.  Her head CT was read as negative for acute findings. Her urinalysis is consistent with urinary tract infection and I suspect this may be the source of her altered mental status. She does not appear to be septic at this point in time.  However given her falls, weakness, and UTI, I will treat her with Rocephin and send urine and blood cultures and admit her to the hospitalist. I have suspicion that she may need rehabilitation stay.    CONSULTATIONS:   Dr. Hilton Sinclair, hospitalist for admission.   Patient / Family / Caregiver informed of clinical course, medical decision-making process, and agree with plan.    ___________________________________________   FINAL CLINICAL IMPRESSION(S) / ED DIAGNOSES   Final diagnoses:  UTI (lower urinary tract infection)  Altered mental status, unspecified altered mental status type  Falls frequently  Generalized weakness              Note: This dictation was prepared with Dragon dictation. Any  transcriptional errors that result from this process are unintentional   Governor Rooks, MD 06/01/16 1431

## 2016-06-02 DIAGNOSIS — N390 Urinary tract infection, site not specified: Secondary | ICD-10-CM | POA: Diagnosis not present

## 2016-06-02 NOTE — Care Management Obs Status (Signed)
MEDICARE OBSERVATION STATUS NOTIFICATION   Patient Details  Name: Holly York MRN: [id number] Date of Birth: 1936-03-02   Medicare Observation Status Notification Given:  Yes    Marily MemosLisa M Kelcey Korus, RN 06/02/2016, 11:38 AM

## 2016-06-02 NOTE — Care Management (Signed)
Observation patient admitted with a UTI. Met with patient and her daughter, Holly York at bedside. Patient lives with her daughter. She uses a cane, walker and wheelchair. HX: falls. PCP :Dr. Raechel Ache.  Daughter concerned that patient may need placement soon. Patient more confused and debilitated. Understands that SNF is not an option under observation unless she can private pay and she can't at this point. She is interested in having a SW come out to work wit her on options. Would benefit from SN, PT, HHA and SW. Daughter prefers [facility]. Referral called to [facility]. No DME needed.

## 2016-06-02 NOTE — Evaluation (Signed)
Physical Therapy Evaluation Patient Details Name: Holly York MRN: [id number] DOB: June 22, 1936 Today's Date: 06/02/2016   History of Present Illness  presented to ER secondary to fall x2 in home environment with L hip pain (x-rays negative); admitted under observation for UTI.  Clinical Impression  Upon evaluation, patient alert and oriented to basic information, but generally confused to more complex information. Unable to accurately relay PLOF, social history; displays poor STM, safety awareness and insight.  Bilat UE/LEs strength globally weak and deconditioned due to acute illness; some episodes of buckling with initial gait trial.   Currently requiring min/mod assist +1-2 (chair follow) for all functional activities and gait with RW; constant cuing for walker management, safety awareness.  Displays very poor balance with very high fall risk. Would benefit from skilled PT to address above deficits and promote optimal return to PLOF; recommend transition to STR upon discharge from acute hospitalization.     Follow Up Recommendations SNF    Equipment Recommendations       Recommendations for Other Services       Precautions / Restrictions Precautions Precautions: Fall Restrictions Weight Bearing Restrictions: No      Mobility  Bed Mobility Overal bed mobility: Needs Assistance Bed Mobility: Supine to Sit     Supine to sit: Min assist     General bed mobility comments: assist for LE management over edge of bed and truncal elevation (with heavy use of UEs on bedrails)  Transfers Overall transfer level: Needs assistance Equipment used: Rolling walker (2 wheeled) Transfers: Sit to/from Stand Sit to Stand: Min assist;Mod assist         General transfer comment: cuing for hand placement to prevent pulling on RW  Ambulation/Gait Ambulation/Gait assistance: Min assist;Mod assist Ambulation Distance (Feet): 8 Feet Assistive device: Rolling walker (2 wheeled)        General Gait Details: broad BOS, forward flexed posture with RW arms length anterior to patient.  Poor ability to negotiate RW, poor balance and safety.  Patient with episodes of LE buckling and attempting to spontaneously sit during gait trial ("I think I need to go to the bathroom"); mod assist and chair required to recover and stabilize patient.  Stairs            Wheelchair Mobility    Modified Rankin (Stroke Patients Only)       Balance Overall balance assessment: Needs assistance Sitting-balance support: No upper extremity supported;Feet supported Sitting balance-Leahy Scale: Fair     Standing balance support: Bilateral upper extremity supported Standing balance-Leahy Scale: Poor                               Pertinent Vitals/Pain Pain Assessment: 0-10 Faces Pain Scale: Hurts a little bit Pain Location: back Pain Descriptors / Indicators: Aching;Sore Pain Intervention(s): Limited activity within patient's tolerance;Monitored during session;Repositioned    Home Living Family/patient expects to be discharged to:: Private residence Living Arrangements: Children Available Help at Discharge: Family Type of Home: House Home Access: Stairs to enter   Secretary/administratorntrance Stairs-Number of Steps: patient unable to provide accurate info Home Layout: One level Home Equipment: Environmental consultantWalker - 2 wheels Additional Comments: question accuracy of PLOF/social history due to patient confusion; will verify with family as available    Prior Function Level of Independence: Independent with assistive device(s)         Comments: Patient reports mod indep with household mobility; assist from daughter with bathing.  Question accuracy of information; will verify with family as available.     Hand Dominance        Extremity/Trunk Assessment   Upper Extremity Assessment: Overall WFL for tasks assessed           Lower Extremity Assessment: Generalized weakness (grossly  3+/5 throughout)         Communication   Communication: No difficulties  Cognition Arousal/Alertness: Awake/alert Behavior During Therapy: Impulsive Overall Cognitive Status: Difficult to assess (oriented to self, location and date; follows simple commands, but demonstrates limited insight/safety awareness.  Limited ability to maintain topic of conversation.  Appears to wax/wane throughout session)       Memory: Decreased short-term memory              General Comments      Exercises Other Exercises Other Exercises: Seated LE therex, 1x10, AROM for muscular strength/endurance--constant verbal cuing for attention to and recall of task. Other Exercises: Toilet transfer, SPT with RW, min assist +2 for safety.  Standing balance with RW, min assist +1; dep of second person for clothing management  Gait x200' with RW, min assist +1 with second person for chair follow. Constant cuing for walker position, management and safety.  Gait somewhat staggered with poor balance overall.  Do continue to recommend use of RW and +1-2 for safety.      Assessment/Plan    PT Assessment Patient needs continued PT services  PT Diagnosis Difficulty walking;Generalized weakness   PT Problem List Decreased strength;Decreased range of motion;Decreased activity tolerance;Decreased balance;Decreased mobility;Decreased coordination;Decreased cognition;Decreased knowledge of use of DME;Decreased safety awareness;Decreased knowledge of precautions  PT Treatment Interventions DME instruction;Gait training;Stair training;Functional mobility training;Therapeutic activities;Therapeutic exercise;Balance training;Cognitive remediation;Patient/family education   PT Goals (Current goals can be found in the Care Plan section) Acute Rehab PT Goals Patient Stated Goal: I need to go to the bathroom PT Goal Formulation: With patient Time For Goal Achievement: 06/16/16 Potential to Achieve Goals: Fair    Frequency Min  2X/week   Barriers to discharge Decreased caregiver support      Co-evaluation               End of Session Equipment Utilized During Treatment: Gait belt Activity Tolerance: Patient tolerated treatment well Patient left: in chair;with call bell/phone within reach;with chair alarm set      Functional Assessment Tool Used: clinical judgement Functional Limitation: Mobility: Walking and moving around Mobility: Walking and Moving Around Current Status [phone]): At least 40 percent but less than 60 percent impaired, limited or restricted Mobility: Walking and Moving Around Goal Status [phone]): At least 1 percent but less than 20 percent impaired, limited or restricted    Time: 1016-1051 PT Time Calculation (min) (ACUTE ONLY): 35 min   Charges:   PT Evaluation $PT Eval Moderate Complexity: 1 Procedure PT Treatments $Therapeutic Activity: 8-22 mins   PT G Codes:   PT G-Codes **NOT FOR INPATIENT CLASS** Functional Assessment Tool Used: clinical judgement Functional Limitation: Mobility: Walking and moving around Mobility: Walking and Moving Around Current Status (U9811): At least 40 percent but less than 60 percent impaired, limited or restricted Mobility: Walking and Moving Around Goal Status [phone]): At least 1 percent but less than 20 percent impaired, limited or restricted    Karlissa Aron H. Manson Passey, PT, DPT, NCS 06/02/2016, 11:13 AM [phone]

## 2016-06-02 NOTE — Progress Notes (Signed)
[facility] [place] [facility]   PATIENT NAME: Holly York    MR#:  [id number]  DATE OF BIRTH:  [birth year]  SUBJECTIVE: 80 year old female patient admitted for fall, altered mental status secondary to UTI. Patient is alert, awake, oriented to day, working with physical therapy. Complains of back pain because of the fall.   CHIEF COMPLAINT:   Chief Complaint  Patient presents with  . Fall    REVIEW OF SYSTEMS:   ROS CONSTITUTIONAL: No fever, fatigue or weakness.  EYES: No blurred or double vision.  EARS, NOSE, AND THROAT: No tinnitus or ear pain.  RESPIRATORY: No cough, shortness of breath, wheezing or hemoptysis.  CARDIOVASCULAR: No chest pain, orthopnea, edema.  GASTROINTESTINAL: No nausea, vomiting, diarrhea or abdominal pain.  GENITOURINARY: No dysuria, hematuria.  ENDOCRINE: No polyuria, nocturia,  HEMATOLOGY: No anemia, easy bruising or bleeding SKIN: No rash or lesion. MUSCULOSKELETAL: Complains of back pain in the middle of the back. NEUROLOGIC: No tingling, numbness, weakness.  PSYCHIATRY: No anxiety or depression.   DRUG ALLERGIES:  No Known Allergies  VITALS:  Blood pressure 136/82, pulse 68, temperature 98 F (36.7 C), temperature source Oral, resp. rate 18, height 5\' 1"  (1.549 m), weight 94.666 kg (208 lb 11.2 oz), SpO2 93 %.  PHYSICAL EXAMINATION:  GENERAL:  80 y.o.-year-old patient lying in the bed with no acute distress.  EYES: Pupils equal, round, reactive to light and accommodation. No scleral icterus. Extraocular muscles intact.  HEENT: Head atraumatic, normocephalic. Oropharynx and nasopharynx clear.  NECK:  Supple, no jugular venous distention. No thyroid enlargement, no tenderness.  LUNGS: Normal breath sounds bilaterally, no wheezing, rales,rhonchi or crepitation. No use of accessory muscles of respiration.  CARDIOVASCULAR: S1, S2 normal. No murmurs, rubs, or gallops.  ABDOMEN: Soft, nontender, nondistended. Bowel  sounds present. No organomegaly or mass.  EXTREMITIES: No pedal edema, cyanosis, or clubbing.  NEUROLOGIC: Cranial nerves II through XII are intact. Muscle strength 5/5 in all extremities. Sensation intact. Gait not checked.  PSYCHIATRIC: The patient is alert and oriented x 3.  SKIN: No obvious rash, lesion, or ulcer.    LABORATORY PANEL:   CBC  Recent Labs Lab 06/01/16 1304  WBC 11.5*  HGB 13.9  HCT 42.5  PLT 209   ------------------------------------------------------------------------------------------------------------------  Chemistries   Recent Labs Lab 06/01/16 1304  NA 139  K 4.1  CL 104  CO2 26  GLUCOSE 115*  BUN 19  CREATININE 0.85  CALCIUM 9.1  AST 38  ALT 15  ALKPHOS 60  BILITOT 1.3*   ------------------------------------------------------------------------------------------------------------------  Cardiac Enzymes  Recent Labs Lab 06/01/16 1304  TROPONINI <0.03   ------------------------------------------------------------------------------------------------------------------  RADIOLOGY:  Ct Head Wo Contrast  06/01/2016  CLINICAL DATA:  Recent falls and increasing confusion EXAM: CT HEAD WITHOUT CONTRAST TECHNIQUE: Contiguous axial images were obtained from the base of the skull through the vertex without intravenous contrast. COMPARISON:  02/10/2012 FINDINGS: Bony calvarium is intact. There are changes consistent with prior embolization as well as prior aneurysm clipping along the falx anteriorly. A ventriculostomy catheter is again seen on the right. Frontal encephalomalacia changes are again noted and stable. No findings to suggest acute hemorrhage, acute infarction or space-occupying mass lesion are noted. 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 IMPRESSION: Chronic ischemic changes and prior surgical changes which are stable from the prior exam. No  acute abnormality noted. Electronically Signed   By:  Alcide Clever M.D.   On: 06/01/2016 13:41   Dg Chest Port 1 View  06/01/2016  CLINICAL DATA:  80 year old female with confusion, cough and falls. EXAM: PORTABLE CHEST 1 VIEW COMPARISON:  11/05/2013 FINDINGS: Cardiomegaly and CABG changes again noted. Bibasilar atelectasis/ scarring again noted. There is no evidence of focal airspace disease, pulmonary edema, suspicious pulmonary nodule/mass, pleural effusion, or pneumothorax. No acute bony abnormalities are identified. IMPRESSION: Cardiomegaly without evidence of acute cardiopulmonary disease. Electronically Signed   By: Harmon Pier M.D.   On: 06/01/2016 13:27   Dg Hip Unilat With Pelvis 2-3 Views Left  06/01/2016  CLINICAL DATA:  Patient fell yesterday and again last night. Left hip pain. Initial encounter. EXAM: DG HIP (WITH OR WITHOUT PELVIS) 2-3V LEFT COMPARISON:  05/28/2015 FINDINGS: There is no evidence of hip fracture or dislocation. There is no evidence of arthropathy or other focal bone abnormality. IMPRESSION: Negative. Electronically Signed   By: Kennith Center M.D.   On: 06/01/2016 15:39    EKG:   Orders placed or performed during the hospital encounter of 06/01/16  . ED EKG  . ED EKG  . EKG 12-Lead  . EKG 12-Lead    ASSESSMENT AND PLAN:  Altered mental status with the metabolic encephalopathy secondary to UTI: Alert and oriented. #2 history of fall, patient left hip x-ray negative for fracture. Because of low back pain she is on oxycodone 5 mg every 4 hours. Discontinue IV morphine. #2 UTI: Patient is on Rocephin. Urine cultures are pending. #4 fall with low back pain: Physical therapy consulted. Likely discharge tomorrow morning.     All the records are reviewed and case discussed with Care Management/Social Workerr. Management plans discussed with the patient, family and they are in agreement.  CODE STATUS: DNR  TOTAL TIME TAKING CARE OF THIS PATIENT: 35 minutes.    POSSIBLE D/C IN 1-2 DAYS, DEPENDING ON CLINICAL CONDITION.   Katha Hamming M.D on 06/02/2016 at 11:07 AM  Between 7am to 6pm - Pager - [phone]  After 6pm go to [email or web address] - password [email or web address] [facility]  [facility]  Office  [phone]  CC: Primary care physician; Mickey Farber, MD   Note: This dictation was prepared with Dragon dictation along with smaller phrase technology. Any transcriptional errors that result from this process are unintentional.

## 2016-06-03 DIAGNOSIS — N390 Urinary tract infection, site not specified: Secondary | ICD-10-CM | POA: Diagnosis not present

## 2016-06-03 LAB — URINE CULTURE

## 2016-06-03 MED ORDER — OXYCODONE HCL 5 MG PO TABS: 5.0000 mg | ORAL_TABLET | ORAL | Status: AC | PRN

## 2016-06-03 MED ORDER — CIPROFLOXACIN HCL 500 MG PO TABS: 500.0000 mg | ORAL_TABLET | Freq: Two times a day (BID) | ORAL | Status: DC

## 2016-06-03 NOTE — Clinical Social Work Placement (Signed)
   CLINICAL SOCIAL WORK PLACEMENT  NOTE  Date:  06/03/2016  Patient Details  Name: Holly York MRN: [id number] Date of Birth: 28-Jul-1936  Clinical Social Work is seeking post-discharge placement for this patient at the Skilled  Nursing Facility level of care (*CSW will initial, date and re-position this form in  chart as items are completed):  Yes   Patient/family provided with [place] Clinical Social Work Department's list of facilities offering this level of care within the geographic area requested by the patient (or if unable, by the patient's family).  Yes   Patient/family informed of their freedom to choose among providers that offer the needed level of care, that participate in Medicare, Medicaid or managed care program needed by the patient, have an available bed and are willing to accept the patient.  Yes   Patient/family informed of [facility]'s ownership interest in [facility] and [facility], as well as of the fact that they are under no obligation to receive care at these facilities.  PASRR submitted to EDS on       PASRR number received on       Existing PASRR number confirmed on 06/03/16     FL2 transmitted to all facilities in geographic area requested by pt/family on 06/03/16     FL2 transmitted to all facilities within larger geographic area on       Patient informed that his/her managed care company has contracts with or will negotiate with certain facilities, including the following:        Yes   Patient/family informed of bed offers received.  Patient chooses bed at  [facility]([place] )     Physician recommends and patient chooses bed at      Patient to be transferred to  [facility]([place] ) on 06/03/16.  Patient to be transferred to facility by  [facility] )     Patient family notified on 06/03/16 of transfer.  Name of family member notified:   (Patient's daughter [name] is at bedside and aware of D/C today. )     PHYSICIAN        Additional Comment:    _______________________________________________ Soo Steelman, Ladon ApplebaumBailey G, LCSW 06/03/2016, 5:16 PM

## 2016-06-03 NOTE — [place] FL2 (Signed)
[place] MEDICAID FL2 LEVEL OF CARE SCREENING TOOL     IDENTIFICATION  Patient Name: Holly York Birthdate: May 29, 1936 Sex: female Admission Date (Current Location): 06/01/2016  [place]   Facility and Address:  [facility], [address], [place], [place] [address]      Provider Number: [id number]  Attending Physician Name and Address:  Wyatt Hasteavid K Hower, MD  Relative Name and Phone Number:       Current Level of Care: Hospital Recommended Level of Care: Skilled Nursing Facility Prior Approval Number:    Date Approved/Denied:   PASRR Number:   [id number]    Discharge Plan: SNF    Current Diagnoses: Patient Active Problem List   Diagnosis Date Noted  . UTI (lower urinary tract infection) 06/01/2016  . Left hip pain 05/29/2015  . CAD (coronary artery disease) 05/29/2015  . Fall at home 05/29/2015    Orientation RESPIRATION BLADDER Height & Weight     Self, Time, Situation, Place  Normal Continent Weight: 208 lb 11.2 oz (94.666 kg) (admission) Height:  5\' 1"  (154.9 cm) (stated)  BEHAVIORAL SYMPTOMS/MOOD NEUROLOGICAL BOWEL NUTRITION STATUS   (none )  (none ) Continent Diet (Diet: Soft )  AMBULATORY STATUS COMMUNICATION OF NEEDS Skin   Extensive Assist Verbally Normal                       Personal Care Assistance Level of Assistance  Bathing, Feeding, Dressing Bathing Assistance: Limited assistance Feeding assistance: Independent Dressing Assistance: Limited assistance     Functional Limitations Info  Sight, Hearing, Speech Sight Info: Adequate Hearing Info: Adequate Speech Info: Adequate    SPECIAL CARE FACTORS FREQUENCY  PT (By licensed PT), OT (By licensed OT)     PT Frequency:  (5) OT Frequency:  (5)            Contractures      Additional Factors Info  Code Status, Allergies Code Status Info:  (DNR ) Allergies Info:  (No Known Allergies. )           Current  Medications (06/03/2016):  This is the current hospital active medication list Current Facility-Administered Medications  Medication Dose Route Frequency Provider Last Rate Last Dose  . acetaminophen (TYLENOL) tablet 650 mg  650 mg Oral Q6H PRN Enedina FinnerSona Patel, MD       Or  . acetaminophen (TYLENOL) suppository 650 mg  650 mg Rectal Q6H PRN Enedina FinnerSona Patel, MD      . aspirin EC tablet 81 mg  81 mg Oral Daily Enedina FinnerSona Patel, MD   81 mg at 06/03/16 0949  . cefTRIAXone (ROCEPHIN) 1 g in dextrose 5 % 50 mL IVPB  1 g Intravenous Q24H Enedina FinnerSona Patel, MD   1 g at 06/02/16 1705  . cholecalciferol (VITAMIN D) tablet 5,000 Units  5,000 Units Oral Daily Enedina FinnerSona Patel, MD   5,000 Units at 06/03/16 0949  . citalopram (CELEXA) tablet 20 mg  20 mg Oral Daily Enedina FinnerSona Patel, MD   20 mg at 06/03/16 0949  . docusate sodium (COLACE) capsule 100 mg  100 mg Oral BID Enedina FinnerSona Patel, MD   100 mg at 06/03/16 0949  . enoxaparin (LOVENOX) injection 40 mg  40 mg Subcutaneous Q24H Enedina FinnerSona Patel, MD   40 mg at 06/02/16 1705  . folic acid (FOLVITE) tablet 1 mg  1,000 mcg Oral Daily Enedina FinnerSona Patel, MD   1 mg at 06/03/16 0949  . multivitamin with minerals  tablet 1 tablet  1 tablet Oral Daily Enedina Finner, MD   1 tablet at 06/03/16 0949  . ondansetron (ZOFRAN) tablet 4 mg  4 mg Oral Q6H PRN Enedina Finner, MD       Or  . ondansetron (ZOFRAN) injection 4 mg  4 mg Intravenous Q6H PRN Enedina Finner, MD      . oxyCODONE (Oxy IR/ROXICODONE) immediate release tablet 5 mg  5 mg Oral Q4H PRN Enedina Finner, MD   5 mg at 06/03/16 1133  . pyridOXINE (VITAMIN B-6) tablet 100 mg  100 mg Oral Daily Enedina Finner, MD   100 mg at 06/03/16 0955  . simvastatin (ZOCOR) tablet 40 mg  40 mg Oral Daily Enedina Finner, MD   40 mg at 06/03/16 0949  . vitamin B-12 (CYANOCOBALAMIN) tablet 100 mcg  100 mcg Oral Daily Enedina Finner, MD   100 mcg at 06/03/16 6213     Discharge Medications: Please see discharge summary for a list of discharge medications.  Relevant Imaging Results:  Relevant Lab  Results:   Additional Information  (SSN: [id number])  Lenny Bouchillon, Ladon Applebaum, LCSW

## 2016-06-03 NOTE — Progress Notes (Signed)
Report called to [facility]/ verbalized an understanding/ iv removed/ EMS called to transport pt.

## 2016-06-03 NOTE — Care Management Note (Addendum)
Case Management Note  Patient Details  Name: Laray Angernna Mae Mccosh MRN: [id number] Date of Birth: [birth year]  Subjective/Objective:     Mrs Lown's caretaker, HCPOA and daughter Cena BentonColleen Hursey previously chose [facility] and requested a HH-SW referral also to assist her with placement for [name]. A request for HH-SW, PT, RN, and Aid was discussed with Dr Clint GuyHower. A referral for HH-PT, RN, Aide, SW was faxed to [facility]. No DME needs per [name] has all needed equipment already in her home per her daughter.            Action/Plan:   Expected Discharge Date:  06/03/16               Expected Discharge Plan:     In-House Referral:     Discharge planning Services     Post Acute Care Choice:    Choice offered to:     DME Arranged:    DME Agency:     HH Arranged:    HH Agency:     Status of Service:     If discussed at [employer] Meetings, dates discussed:    Additional Comments:  Kenon Delashmit A, RN 06/03/2016, 9:49 AM

## 2016-06-03 NOTE — Clinical Social Work Note (Signed)
Clinical Social Work Assessment  Patient Details  Name: Holly York MRN: [id number] Date of Birth: 09-05-1936  Date of referral:  06/03/16               Reason for consult:  Facility Placement                Permission sought to share information with:  Ocean[facility]ility Contact Representative Permission granted to share information::  Yes, Verbal Permission Granted  Name::      Skilled Nursing Facility   Agency::   [place]   Relationship::     Contact Information:     Housing/Transportation Living arrangements for the past 2 months:  Single Family Home Source of Information:  Patient, Adult Children Patient Interpreter Needed:  None Criminal Activity/Legal Involvement Pertinent to Current Situation/Hospitalization:  No - Comment as needed Significant Relationships:  Adult Children Lives with:  Self Do you feel safe going back to the place where you live?  Yes Need for family participation in patient care:  Yes (Comment)  Care giving concerns:  [place]    Social Worker assessment / plan:  Visual merchandiserClinical Social Worker (CSW) received a call from [employer] stating that patient's daughter wants patient to go to SNF. Patient already had a discharge order in for home. CSW contacted patient's daughter Holly York and explained SNF options. CSW explained that since patient is under Medicare observation then she will have to pay out of pocket for SNF. Per daughter patient can pay out of pocket for SNF and prefers [facility]. [facility].   Per Judeth CornfieldStephanie patient can go to room A-15 for $200 per day. Judeth CornfieldStephanie reported that patient's daughter can meet with her on Monday to complete paper work. RN will call report and arrange EMS for transport. CSW sent D/C Summary, FL2 and D/C Packet to [facility] via [employer]. Patient and daughter are agreeable to plan. Please reconsult if future social work needs arise. CSW signing off.    Employment status:  Retired Health and safety inspectornsurance information:  Medicare PT Recommendations:  Skilled Nursing Facility Information / Referral to community resources:  Skilled Nursing Facility  Patient/Family's Response to care:  Patient and daughter are agreeable to private pay at [facility] for [employer].   Patient/Family's Understanding of and Emotional Response to Diagnosis, Current Treatment, and Prognosis:  Patient and daughter were pleasant and thanked CSW for visit.   Emotional Assessment Appearance:  Appears stated age Attitude/Demeanor/Rapport:    Affect (typically observed):  Accepting, Adaptable, Pleasant Orientation:  Oriented to Self, Oriented to Place, Oriented to  Time, Oriented to Situation Alcohol / Substance use:  Not Applicable Psych involvement (Current and /or in the community):  No (Comment)  Discharge Needs  Concerns to be addressed:  Discharge Planning Concerns Readmission within the last 30 days:  No Current discharge risk:  Dependent with Mobility Barriers to Discharge:  No Barriers Identified   Petar Mucci, Ladon ApplebaumBailey G, LCSW 06/03/2016, 5:17 PM

## 2016-06-03 NOTE — Discharge Summary (Signed)
[facility] [place] at [facility]   PATIENT NAME: Holly York    MR#:  [id number]  DATE OF BIRTH:  14-Jun-1936  DATE OF ADMISSION:  06/01/2016 ADMITTING PHYSICIAN: Enedina Finner, MD  DATE OF DISCHARGE: 06/03/2016  PRIMARY CARE PHYSICIAN: Mickey Farber, MD    ADMISSION DIAGNOSIS:  Falls frequently [R29.6] UTI (lower urinary tract infection) [N39.0] Fall [W19.XXXA] Generalized weakness [R53.1] Altered mental status, unspecified altered mental status type [R41.82]  DISCHARGE DIAGNOSIS:  Active Problems:   UTI (lower urinary tract infection) generalized weakness  SECONDARY DIAGNOSIS:   Past Medical History  Diagnosis Date  . Heart attack (HCC)   . Stroke (HCC)   . Hypercholesterolemia   . Anxiety     HOSPITAL COURSE:  Holly York  is a 80 y.o. female admitted 06/01/2016 with chief complaint Fall . Please see H&P performed by Enedina Finner, MD for further information. Patient admitted with falls, generalized weakness. Found to have UTI on admission - positive Ua, dysuria, increased urinary frequency. She was placed on antibiotics - ceftriaxone, with improvement. On evaluation with PT they noted continued weakness and she would benefit from home health including PT  DISCHARGE CONDITIONS:   stable  CONSULTS OBTAINED:     DRUG ALLERGIES:  No Known Allergies  DISCHARGE MEDICATIONS:   Current Discharge Medication List    START taking these medications   Details  ciprofloxacin (CIPRO) 500 MG tablet Take 1 tablet (500 mg total) by mouth 2 (two) times daily. Qty: 4 tablet, Refills: 0    oxyCODONE (OXY IR/ROXICODONE) 5 MG immediate release tablet Take 1 tablet (5 mg total) by mouth every 4 (four) hours as needed for moderate pain. Qty: 15 tablet, Refills: 0      CONTINUE these medications which have NOT CHANGED   Details  aspirin 81 MG tablet Take 81 mg by mouth daily.    Cholecalciferol (VITAMIN D3) 5000 UNITS CAPS Take by mouth daily.      citalopram (CELEXA) 20 MG tablet Take 20 mg by mouth daily.    cyanocobalamin 1000 MCG tablet Take 100 mcg by mouth daily.    folic acid (FOLVITE) 800 MCG tablet Take 400 mcg by mouth daily.    Multiple Vitamins-Minerals (MULTIVITAMIN WITH MINERALS) tablet Take 1 tablet by mouth daily.    pyridOXINE (VITAMIN B-6) 100 MG tablet Take 100 mg by mouth daily.    simvastatin (ZOCOR) 40 MG tablet Take 40 mg by mouth daily.         DISCHARGE INSTRUCTIONS:    DIET:  Regular diet  DISCHARGE CONDITION:  Stable  ACTIVITY:  Activity as tolerated  OXYGEN:  Home Oxygen: No.   Oxygen Delivery: room air  DISCHARGE LOCATION:  home   If you experience worsening of your admission symptoms, develop shortness of breath, life threatening emergency, suicidal or homicidal thoughts you must seek medical attention immediately by calling 911 or calling your MD immediately  if symptoms less severe.  You Must read complete instructions/literature along with all the possible adverse reactions/side effects for all the Medicines you take and that have been prescribed to you. Take any new Medicines after you have completely understood and accpet all the possible adverse reactions/side effects.   Please note  You were cared for by a hospitalist during your hospital stay. If you have any questions about your discharge medications or the care you received while you were in the hospital after you are discharged, you can call the unit and asked to speak  with the hospitalist on call if the hospitalist that took care of you is not available. Once you are discharged, your primary care physician will handle any further medical issues. Please note that NO REFILLS for any discharge medications will be authorized once you are discharged, as it is imperative that you return to your primary care physician (or establish a relationship with a primary care physician if you do not have one) for your aftercare needs so that  they can reassess your need for medications and monitor your lab values.    On the day of Discharge:   VITAL SIGNS:  Blood pressure 160/72, pulse 72, temperature 98.1 F (36.7 C), temperature source Oral, resp. rate 18, height 5\' 1"  (1.549 m), weight 208 lb 11.2 oz (94.666 kg), SpO2 94 %.  I/O:   Intake/Output Summary (Last 24 hours) at 06/03/16 0948 Last data filed at 06/03/16 0902  Gross per 24 hour  Intake      0 ml  Output   2250 ml  Net  -2250 ml    PHYSICAL EXAMINATION:  GENERAL:  80 y.o.-year-old patient lying in the bed with no acute distress.  EYES: Pupils equal, round, reactive to light and accommodation. No scleral icterus. Extraocular muscles intact.  HEENT: Head atraumatic, normocephalic. Oropharynx and nasopharynx clear.  NECK:  Supple, no jugular venous distention. No thyroid enlargement, no tenderness.  LUNGS: Normal breath sounds bilaterally, no wheezing, rales,rhonchi or crepitation. No use of accessory muscles of respiration.  CARDIOVASCULAR: S1, S2 normal. No murmurs, rubs, or gallops.  ABDOMEN: Soft, non-tender, non-distended. Bowel sounds present. No organomegaly or mass.  EXTREMITIES: No pedal edema, cyanosis, or clubbing.  NEUROLOGIC: Cranial nerves II through XII are intact. Muscle strength 5/5 in all extremities. Sensation intact. Gait not checked.  PSYCHIATRIC: The patient is alert and oriented x 3.  SKIN: No obvious rash, lesion, or ulcer.   DATA REVIEW:   CBC  Recent Labs Lab 06/01/16 1304  WBC 11.5*  HGB 13.9  HCT 42.5  PLT 209    Chemistries   Recent Labs Lab 06/01/16 1304  NA 139  K 4.1  CL 104  CO2 26  GLUCOSE 115*  BUN 19  CREATININE 0.85  CALCIUM 9.1  AST 38  ALT 15  ALKPHOS 60  BILITOT 1.3*    Cardiac Enzymes  Recent Labs Lab 06/01/16 1304  TROPONINI <0.03    Microbiology Results  Results for orders placed or performed during the hospital encounter of 06/01/16  Culture, blood (routine x 2)     Status: None  (Preliminary result)   Collection Time: 06/01/16  4:07 PM  Result Value Ref Range Status   Specimen Description BLOOD LEFT ASSIST CONTROL  Final   Special Requests BOTTLES DRAWN AEROBIC AND ANAEROBIC 5CC  Final   Culture NO GROWTH 2 DAYS  Final   Report Status PENDING  Incomplete  Culture, blood (routine x 2)     Status: None (Preliminary result)   Collection Time: 06/01/16  4:07 PM  Result Value Ref Range Status   Specimen Description BLOOD RIGHT ASSIST CONTROL  Final   Special Requests   Final    BOTTLES DRAWN AEROBIC AND ANAEROBIC AEROBIC 5 CC, ANAEROBIC 1 CC   Culture NO GROWTH 2 DAYS  Final   Report Status PENDING  Incomplete    RADIOLOGY:  Ct Head Wo Contrast  06/01/2016  CLINICAL DATA:  Recent falls and increasing confusion EXAM: CT HEAD WITHOUT CONTRAST TECHNIQUE: Contiguous axial images were obtained from  the base of the skull through the vertex without intravenous contrast. COMPARISON:  02/10/2012 FINDINGS: Bony calvarium is intact. There are changes consistent with prior embolization as well as prior aneurysm clipping along the falx anteriorly. A ventriculostomy catheter is again seen on the right. Frontal encephalomalacia changes are again noted and stable. No findings to suggest acute hemorrhage, acute infarction or space-occupying mass lesion are noted. 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 IMPRESSION: Chronic ischemic changes and prior surgical changes which are stable from the prior exam. No acute abnormality noted. Electronically Signed   By: Alcide Clever M.D.   On: 06/01/2016 13:41   Dg Chest Port 1 View  06/01/2016  CLINICAL DATA:  80 year old female with confusion, cough and falls. EXAM: PORTABLE CHEST 1 VIEW COMPARISON:  11/05/2013 FINDINGS: Cardiomegaly and CABG changes again noted. Bibasilar atelectasis/ scarring again noted. There is no evidence of focal airspace  disease, pulmonary edema, suspicious pulmonary nodule/mass, pleural effusion, or pneumothorax. No acute bony abnormalities are identified. IMPRESSION: Cardiomegaly without evidence of acute cardiopulmonary disease. Electronically Signed   By: Harmon Pier M.D.   On: 06/01/2016 13:27   Dg Hip Unilat With Pelvis 2-3 Views Left  06/01/2016  CLINICAL DATA:  Patient fell yesterday and again last night. Left hip pain. Initial encounter. EXAM: DG HIP (WITH OR WITHOUT PELVIS) 2-3V LEFT COMPARISON:  05/28/2015 FINDINGS: There is no evidence of hip fracture or dislocation. There is no evidence of arthropathy or other focal bone abnormality. IMPRESSION: Negative. Electronically Signed   By: Kennith Center M.D.   On: 06/01/2016 15:39     Management plans discussed with the patient, family and they are in agreement.  CODE STATUS:     Code Status Orders        Start     Ordered   06/01/16 1717  Do not attempt resuscitation (DNR)   Continuous    Question Answer Comment  In the event of cardiac or respiratory ARREST Do not call a "code blue"   In the event of cardiac or respiratory ARREST Do not perform Intubation, CPR, defibrillation or ACLS   In the event of cardiac or respiratory ARREST Use medication by any route, position, wound care, and other measures to relive pain and suffering. May use oxygen, suction and manual treatment of airway obstruction as needed for comfort.      06/01/16 1716    Code Status History    Date Active Date Inactive Code Status Order ID Comments User Context   05/29/2015  2:06 AM 05/31/2015  7:11 PM Full Code [id number]  Crissie Figures, MD Inpatient    Advance Directive Documentation        Most Recent Value   Type of Advance Directive  Healthcare Power of Attorney, Out of facility DNR (pink MOST or yellow form)   Pre-existing out of facility DNR order (yellow form or pink MOST form)  Yellow form placed in chart (order not valid for inpatient use)   "MOST" Form in Place?          TOTAL TIME TAKING CARE OF THIS PATIENT: 28 minutes.    Hower,  Mardi Mainland.D on 06/03/2016 at 9:48 AM  Between 7am to 6pm - Pager - [phone]  After 6pm go to [email or web address] - Social research officer, government  [facility] [place] [facility]  Office  [phone]  CC: Primary care physician; Mickey Farber, MD

## 2016-06-06 LAB — CULTURE, BLOOD (ROUTINE X 2)
CULTURE: NO GROWTH
Culture: NO GROWTH

## 2016-09-12 ENCOUNTER — Inpatient Hospital Stay: Payer: Medicare Other

## 2016-09-12 ENCOUNTER — Inpatient Hospital Stay
Admission: EM | Admit: 2016-09-12 | Discharge: 2016-09-14 | DRG: 871 | Disposition: A | Discharge status: Hospice | Payer: Medicare Other | Attending: Internal Medicine | Admitting: Internal Medicine

## 2016-09-12 ENCOUNTER — Emergency Department: Payer: Medicare Other

## 2016-09-12 DIAGNOSIS — I252 Old myocardial infarction: Secondary | ICD-10-CM | POA: Diagnosis not present

## 2016-09-12 DIAGNOSIS — A419 Sepsis, unspecified organism: Secondary | ICD-10-CM

## 2016-09-12 DIAGNOSIS — E872 Acidosis: Secondary | ICD-10-CM | POA: Diagnosis present

## 2016-09-12 DIAGNOSIS — Z7982 Long term (current) use of aspirin: Secondary | ICD-10-CM | POA: Diagnosis not present

## 2016-09-12 DIAGNOSIS — E861 Hypovolemia: Secondary | ICD-10-CM | POA: Diagnosis present

## 2016-09-12 DIAGNOSIS — F419 Anxiety disorder, unspecified: Secondary | ICD-10-CM | POA: Diagnosis present

## 2016-09-12 DIAGNOSIS — R4182 Altered mental status, unspecified: Secondary | ICD-10-CM

## 2016-09-12 DIAGNOSIS — E870 Hyperosmolality and hypernatremia: Secondary | ICD-10-CM | POA: Diagnosis present

## 2016-09-12 DIAGNOSIS — R6520 Severe sepsis without septic shock: Secondary | ICD-10-CM | POA: Diagnosis present

## 2016-09-12 DIAGNOSIS — I959 Hypotension, unspecified: Secondary | ICD-10-CM | POA: Diagnosis present

## 2016-09-12 DIAGNOSIS — I2510 Atherosclerotic heart disease of native coronary artery without angina pectoris: Secondary | ICD-10-CM | POA: Diagnosis present

## 2016-09-12 DIAGNOSIS — Z8673 Personal history of transient ischemic attack (TIA), and cerebral infarction without residual deficits: Secondary | ICD-10-CM | POA: Diagnosis not present

## 2016-09-12 DIAGNOSIS — Z951 Presence of aortocoronary bypass graft: Secondary | ICD-10-CM

## 2016-09-12 DIAGNOSIS — N390 Urinary tract infection, site not specified: Secondary | ICD-10-CM | POA: Diagnosis present

## 2016-09-12 DIAGNOSIS — R319 Hematuria, unspecified: Secondary | ICD-10-CM

## 2016-09-12 DIAGNOSIS — N179 Acute kidney failure, unspecified: Secondary | ICD-10-CM | POA: Diagnosis present

## 2016-09-12 DIAGNOSIS — R739 Hyperglycemia, unspecified: Secondary | ICD-10-CM | POA: Diagnosis present

## 2016-09-12 DIAGNOSIS — Z66 Do not resuscitate: Secondary | ICD-10-CM | POA: Diagnosis present

## 2016-09-12 DIAGNOSIS — A4151 Sepsis due to Escherichia coli [E. coli]: Principal | ICD-10-CM | POA: Diagnosis present

## 2016-09-12 DIAGNOSIS — G9340 Encephalopathy, unspecified: Secondary | ICD-10-CM

## 2016-09-12 DIAGNOSIS — Z515 Encounter for palliative care: Secondary | ICD-10-CM | POA: Diagnosis present

## 2016-09-12 DIAGNOSIS — E7800 Pure hypercholesterolemia, unspecified: Secondary | ICD-10-CM | POA: Diagnosis present

## 2016-09-12 DIAGNOSIS — Z79899 Other long term (current) drug therapy: Secondary | ICD-10-CM | POA: Diagnosis not present

## 2016-09-12 DIAGNOSIS — Z8249 Family history of ischemic heart disease and other diseases of the circulatory system: Secondary | ICD-10-CM

## 2016-09-12 DIAGNOSIS — R0902 Hypoxemia: Secondary | ICD-10-CM

## 2016-09-12 LAB — CBC
HCT: 39.2 % (ref 35.0–47.0)
HEMOGLOBIN: 13.2 g/dL (ref 12.0–16.0)
MCH: 29.2 pg (ref 26.0–34.0)
MCHC: 33.6 g/dL (ref 32.0–36.0)
MCV: 87 fL (ref 80.0–100.0)
Platelets: 230 10*3/uL (ref 150–440)
RBC: 4.5 MIL/uL (ref 3.80–5.20)
RDW: 16.3 % — ABNORMAL HIGH (ref 11.5–14.5)
WBC: 12.2 10*3/uL — ABNORMAL HIGH (ref 3.6–11.0)

## 2016-09-12 LAB — URINALYSIS COMPLETE WITH MICROSCOPIC (ARMC ONLY)
BILIRUBIN URINE: NEGATIVE
Glucose, UA: NEGATIVE mg/dL
Ketones, ur: NEGATIVE mg/dL
Nitrite: NEGATIVE
PH: 6 (ref 5.0–8.0)
PROTEIN: 100 mg/dL — AB
Specific Gravity, Urine: 1.006 (ref 1.005–1.030)

## 2016-09-12 LAB — BASIC METABOLIC PANEL
ANION GAP: 13 (ref 5–15)
BUN: 49 mg/dL — ABNORMAL HIGH (ref 6–20)
CALCIUM: 9.5 mg/dL (ref 8.9–10.3)
CHLORIDE: 107 mmol/L (ref 101–111)
CO2: 22 mmol/L (ref 22–32)
Creatinine, Ser: 4.6 mg/dL — ABNORMAL HIGH (ref 0.44–1.00)
GFR calc Af Amer: 10 mL/min — ABNORMAL LOW (ref >60)
GFR calc non Af Amer: 8 mL/min — ABNORMAL LOW (ref >60)
GLUCOSE: 126 mg/dL — AB (ref 65–99)
Potassium: 4.1 mmol/L (ref 3.5–5.1)
Sodium: 142 mmol/L (ref 135–145)

## 2016-09-12 LAB — TSH: TSH: 11.888 u[IU]/mL — AB (ref 0.350–4.500)

## 2016-09-12 LAB — LACTIC ACID, PLASMA: LACTIC ACID, VENOUS: 0.8 mmol/L (ref 0.5–1.9)

## 2016-09-12 LAB — TROPONIN I
TROPONIN I: 0.03 ng/mL — AB (ref <0.03)
TROPONIN I: 0.03 ng/mL — AB (ref <0.03)

## 2016-09-12 LAB — MRSA PCR SCREENING: MRSA by PCR: NEGATIVE

## 2016-09-12 MED ORDER — ONDANSETRON HCL 4 MG/2ML IJ SOLN: 4.0000 mg | Freq: Four times a day (QID) | INTRAMUSCULAR | Status: DC | PRN

## 2016-09-12 MED ORDER — DEXTROSE 5 % IV SOLN: 1.0000 g | INTRAVENOUS | Status: DC

## 2016-09-12 MED ORDER — HEPARIN SODIUM (PORCINE) 5000 UNIT/ML IJ SOLN
5000.0000 [IU] | Freq: Three times a day (TID) | INTRAMUSCULAR | Status: DC
  Administered 2016-09-12 – 2016-09-14 (×5): 5000 [IU] via SUBCUTANEOUS
  Filled 2016-09-12 (×5): qty 1

## 2016-09-12 MED ORDER — SIMVASTATIN 40 MG PO TABS: 40.0000 mg | ORAL_TABLET | Freq: Every day | ORAL | Status: DC

## 2016-09-12 MED ORDER — ADULT MULTIVITAMIN W/MINERALS CH
1.0000 | ORAL_TABLET | Freq: Every day | ORAL | Status: DC
  Filled 2016-09-12 (×3): qty 1

## 2016-09-12 MED ORDER — VITAMIN B-6 100 MG PO TABS
100.0000 mg | ORAL_TABLET | Freq: Every day | ORAL | Status: DC
  Filled 2016-09-12 (×2): qty 1

## 2016-09-12 MED ORDER — BISACODYL 10 MG RE SUPP: 10.0000 mg | Freq: Every day | RECTAL | Status: DC | PRN

## 2016-09-12 MED ORDER — VITAMIN B-12 100 MCG PO TABS
100.0000 ug | ORAL_TABLET | Freq: Every day | ORAL | Status: DC
  Filled 2016-09-12 (×3): qty 1

## 2016-09-12 MED ORDER — SODIUM CHLORIDE 0.9 % IV SOLN
INTRAVENOUS | Status: DC
  Administered 2016-09-12 – 2016-09-13 (×2): via INTRAVENOUS

## 2016-09-12 MED ORDER — CITALOPRAM HYDROBROMIDE 20 MG PO TABS
20.0000 mg | ORAL_TABLET | Freq: Every day | ORAL | Status: DC
  Filled 2016-09-12 (×2): qty 1

## 2016-09-12 MED ORDER — CEFTRIAXONE SODIUM-DEXTROSE 1-3.74 GM-% IV SOLR
1.0000 g | Freq: Once | INTRAVENOUS | Status: AC
  Administered 2016-09-12: 1 g via INTRAVENOUS
  Filled 2016-09-12: qty 50

## 2016-09-12 MED ORDER — VALACYCLOVIR HCL 500 MG PO TABS: 1000.0000 mg | ORAL_TABLET | Freq: Three times a day (TID) | ORAL | Status: DC

## 2016-09-12 MED ORDER — SODIUM CHLORIDE 0.9 % IV BOLUS (SEPSIS)
1000.0000 mL | Freq: Once | INTRAVENOUS | Status: AC
  Administered 2016-09-12: 1000 mL via INTRAVENOUS

## 2016-09-12 MED ORDER — FOLIC ACID 1 MG PO TABS
500.0000 ug | ORAL_TABLET | Freq: Every day | ORAL | Status: DC
  Filled 2016-09-12 (×2): qty 1

## 2016-09-12 MED ORDER — VITAMIN D3 125 MCG (5000 UT) PO CAPS
1.0000 | ORAL_CAPSULE | Freq: Every day | ORAL | Status: DC
  Filled 2016-09-12 (×2): qty 1

## 2016-09-12 MED ORDER — DEXTROSE 5 % IV SOLN: 1.0000 g | Freq: Once | INTRAVENOUS | Status: DC

## 2016-09-12 MED ORDER — CEFTRIAXONE SODIUM-DEXTROSE 1-3.74 GM-% IV SOLR
1.0000 g | INTRAVENOUS | Status: DC
  Administered 2016-09-13 – 2016-09-14 (×2): 1 g via INTRAVENOUS
  Filled 2016-09-12 (×2): qty 50

## 2016-09-12 MED ORDER — ONDANSETRON HCL 4 MG PO TABS: 4.0000 mg | ORAL_TABLET | Freq: Four times a day (QID) | ORAL | Status: DC | PRN

## 2016-09-12 MED ORDER — ASPIRIN EC 81 MG PO TBEC
81.0000 mg | DELAYED_RELEASE_TABLET | Freq: Every day | ORAL | Status: DC
  Filled 2016-09-12 (×2): qty 1

## 2016-09-12 NOTE — H&P (Addendum)
[facility] [place] at [facility]   PATIENT NAME: Holly York    MR#:  [id number]  DATE OF BIRTH:  Dec 24, 1935  DATE OF ADMISSION:  09/12/2016  PRIMARY CARE PHYSICIAN: Mickey Farber, MD   REQUESTING/REFERRING PHYSICIAN:   CHIEF COMPLAINT:   Chief Complaint  Patient presents with  . Loss of Consciousness    HISTORY OF PRESENT ILLNESS: Holly York  is a 80 y.o. female with a known history of Anxiety, coronary artery disease, hyperlipidemia, stroke, due to brain aneurysm repair in 1988 in 2010, open heart surgery, MRSA infections in the past, who presents to the hospital with complaints of altered mental status, confusion. According to patient's family, she was noted to be confused yesterday. Talking out of head. She was walking and was fine on Sunday, few days ago. Today, however, she stopped talking and was sent to emergency room due to concerns of possible stroke. In emergency room, her labs revealed acute renal failure with creatinine of 4.6, urinalysis was concerning for urinary tract infection. Patient was hypotensive with systolic blood pressure in 90s, tachycardic to 110. Due to concerns of sepsis due to urinary tract infection and acute renal failure, hospitalist services were contacted for admission. The patient is nonverbal, not able to review systems  PAST MEDICAL HISTORY:   Past Medical History:  Diagnosis Date  . Anxiety   . Heart attack   . Hypercholesterolemia   . Stroke [facility])     PAST SURGICAL HISTORY: Past Surgical History:  Procedure Laterality Date  . BRAIN SURGERY    . CORONARY ARTERY BYPASS GRAFT      SOCIAL HISTORY:  Social History  Substance Use Topics  . Smoking status: Never Smoker  . Smokeless tobacco: Never Used  . Alcohol use No    FAMILY HISTORY:  Family History  Problem Relation Age of Onset  . Alcoholism Father   . Heart disease Brother     DRUG ALLERGIES: No Known Allergies  Review of Systems   Unable to perform ROS: Mental status change    MEDICATIONS AT HOME:  Prior to Admission medications   Medication Sig Start Date End Date Taking? Authorizing Provider  aspirin 81 MG tablet Take 81 mg by mouth daily.   Yes Historical Provider, MD  Cholecalciferol (VITAMIN D3) 5000 UNITS CAPS Take by mouth daily.   Yes Historical Provider, MD  citalopram (CELEXA) 20 MG tablet Take 20 mg by mouth daily.   Yes Historical Provider, MD  cyanocobalamin 1000 MCG tablet Take 100 mcg by mouth daily.   Yes Historical Provider, MD  folic acid (FOLVITE) 800 MCG tablet Take 400 mcg by mouth daily.   Yes Historical Provider, MD  Multiple Vitamins-Minerals (MULTIVITAMIN WITH MINERALS) tablet Take 1 tablet by mouth daily.   Yes Historical Provider, MD  oxyCODONE (OXY IR/ROXICODONE) 5 MG immediate release tablet Take 1 tablet (5 mg total) by mouth every 4 (four) hours as needed for moderate pain. 06/03/16  Yes Wyatt Haste, MD  pyridOXINE (VITAMIN B-6) 100 MG tablet Take 100 mg by mouth daily.   Yes Historical Provider, MD  simvastatin (ZOCOR) 40 MG tablet Take 40 mg by mouth daily.   Yes Historical Provider, MD  valACYclovir (VALTREX) 500 MG tablet Take 1,000 mg by mouth 3 (three) times daily.   Yes Historical Provider, MD      PHYSICAL EXAMINATION:   VITAL SIGNS: Blood pressure 101/70, pulse 78, temperature 97.8 F (36.6 C), temperature source Rectal, resp. rate 19, height  5\' 5"  (1.651 m), weight 71.2 kg (156 lb 14.4 oz), SpO2 92 %.  GENERAL:  80 y.o.-year-old patient lying in the bed with no acute distress. Sitting on the stretcher looking around, not following commands, smiles intermittently. However, remained nonverbal EYES: Pupils equal, round, reactive to light and accommodation. No scleral icterus. Extraocular muscles intact.  HEENT: Head atraumatic, normocephalic. Oropharynx and nasopharynx clear.  NECK:  Supple, no jugular venous distention. No thyroid enlargement, no tenderness.  LUNGS: Normal  breath sounds bilaterally, no wheezing, rales,rhonchi or crepitation. No use of accessory muscles of respiration.  CARDIOVASCULAR: S1, S2 normal, intermittently irregular. No murmurs, rubs, or gallops.  ABDOMEN: Soft, nontender, nondistended. Bowel sounds present. No organomegaly or mass.  EXTREMITIES: No pedal edema, cyanosis, or clubbing. Withdraws all extremities from pain NEUROLOGIC: Cranial nerves II through XII are intact. Muscle strength 5/5, difficulty evaluate in all extremities, withdraws all extremities from pain. Sensation not able to assess. Gait not checked. Does not follow commands, intermittently smiles, nonverbal PSYCHIATRIC: The patient is alert , not able to assess orientation due to altered mental status, nonverbal, does not follow commands.  SKIN: No obvious rash, lesion, or ulcer.   LABORATORY PANEL:   CBC  Recent Labs Lab 09/12/16 0933  WBC 12.2*  HGB 13.2  HCT 39.2  PLT 230  MCV 87.0  MCH 29.2  MCHC 33.6  RDW 16.3*   ------------------------------------------------------------------------------------------------------------------  Chemistries   Recent Labs Lab 09/12/16 0933  NA 142  K 4.1  CL 107  CO2 22  GLUCOSE 126*  BUN 49*  CREATININE 4.60*  CALCIUM 9.5   ------------------------------------------------------------------------------------------------------------------  Cardiac Enzymes No results for input(s): TROPONINI in the last 168 hours. ------------------------------------------------------------------------------------------------------------------  RADIOLOGY: Ct Head Wo Contrast  Result Date: 09/12/2016 CLINICAL DATA:  Altered mental status. EXAM: CT HEAD WITHOUT CONTRAST TECHNIQUE: Contiguous axial images were obtained from the base of the skull through the vertex without intravenous contrast. COMPARISON:  06/01/2016 FINDINGS: Brain: Would infarcts in the right cerebellar hemisphere. Ventricular shunt noted in the right frontal  region terminating in the mid right lateral ventricle or possibly third ventricle. Extensive chronic small vessel disease changes throughout the deep white matter. Old infarcts in the right frontal and posterior right parietal lobes. No acute infarction, hemorrhage or hydrocephalus. Vascular: No hyperdense vessel or unexpected calcification. Skull: Normal. Negative for fracture or focal lesion. Sinuses/Orbits: Visualized paranasal sinuses and mastoids clear. Orbital soft tissues unremarkable. Other: None IMPRESSION: Areas of old infarcts in the right frontal, right parietal and right occipital lobes. Stable right ventricular shunt.  No hydrocephalus. Chronic small vessel ischemic changes, stable. No acute intracranial abnormality. Electronically Signed   By: Charlett Nose M.D.   On: 09/12/2016 11:05    EKG: Orders placed or performed during the hospital encounter of 09/12/16  . ED EKG  . ED EKG  . EKG 12-Lead  . EKG 12-Lead  Age. In emergency room revealed sinus tachycardia at rate of 102. Probable left atrial enlargement, low voltage QRS in precordial leads, probable anteroseptal infarct with QS in V1, V2, no acute ST-T changes  IMPRESSION AND PLAN:  Principal Problem:   Acute renal failure (ARF) (HCC) Active Problems:   Sepsis (HCC)   UTI (urinary tract infection)   Hypotension   Encephalopathy acute   Hyperglycemia #1. Acute renal failure, admit patient to medical floor, initiate IV fluids, follow creatinine in the morning, get nephrologist involved for recommendations, get renal ultrasound #2. Urinary tract infection, and urinary, blood cultures, initiate Rocephin, adjust antibiotics  depending on culture results #3 hypotension, IV fluids at high rate #4 acute encephalopathy, follow clinically, neuro checks every few hours. Speech therapist evaluation, RN to do bedside swallowing evaluation, IV fluids for now. Place Foley catheter #5. Hyperglycemia, get hemoglobin A1c to rule out  diabetes  All the records are reviewed and case discussed with ED provider. Management plans discussed with the patient, family and they are in agreement.  CODE STATUS: Code Status History    Date Active Date Inactive Code Status Order ID Comments User Context   06/01/2016  5:16 PM 06/03/2016  8:47 PM DNR [id number]  Enedina FinnerSona Patel, MD Inpatient   05/29/2015  2:06 AM 05/31/2015  7:11 PM Full Code [id number]  Crissie FiguresEdavally N Reddy, MD Inpatient    Questions for Most Recent Historical Code Status (Order [id number])    Question Answer Comment   In the event of cardiac or respiratory ARREST Do not call a "code blue"    In the event of cardiac or respiratory ARREST Do not perform Intubation, CPR, defibrillation or ACLS    In the event of cardiac or respiratory ARREST Use medication by any route, position, wound care, and other measures to relive pain and suffering. May use oxygen, suction and manual treatment of airway obstruction as needed for comfort.         Advance Directive Documentation   Flowsheet Row Most Recent Value  Type of Advance Directive  Out of facility DNR (pink MOST or yellow form)  Pre-existing out of facility DNR order (yellow form or pink MOST form)  Yellow form placed in chart (order not valid for inpatient use)  "MOST" Form in Place?  No data       TOTAL Critical care TIME TAKING CARE OF THIS PATIENT: 50 minutes.    Katharina CaperVAICKUTE,Briante Loveall M.D on 09/12/2016 at 1:12 PM  Between 7am to 6pm - Pager - [phone] After 6pm go to [email or web address] - password [email or web address]  [facility]  Office  [phone]  CC: Primary care physician; Mickey FarberHIES, DAVID, MD

## 2016-09-12 NOTE — ED Provider Notes (Signed)
[facility] Emergency Department Provider Note ____________________________________________   I have reviewed the triage vital signs and the triage nursing note.  HISTORY  Chief Complaint Loss of Consciousness   Historian Level V caveat, patient poor historian/altered mental status History per daughter, health care power of attorney  HPI Holly York is a 80 y.o. female currently stays at a nursing home after what sounds like months of decline, most recently a TIA/stroke per the daughter and urinary tract infections which are recurrent. Daughter got a phone call this morning that her mom may have passed out. Daughter states that her mother seemed a little confused 2 days ago and yesterday afternoon more so reported by the granddaughter. This morning she is very much altered from her baseline. She is typically conversant and able to walk. Since last night she was kind of off balance, and confused and talking about her husband who has passed.  No reported fevers or vomiting. No report that she's been complaining of pain.    Past Medical History:  Diagnosis Date  . Anxiety   . Heart attack   . Hypercholesterolemia   . Stroke [facility])     Patient Active Problem List   Diagnosis Date Noted  . UTI (lower urinary tract infection) 06/01/2016  . Left hip pain 05/29/2015  . CAD (coronary artery disease) 05/29/2015  . Fall at home 05/29/2015    Past Surgical History:  Procedure Laterality Date  . BRAIN SURGERY    . CORONARY ARTERY BYPASS GRAFT      Prior to Admission medications   Medication Sig Start Date End Date Taking? Authorizing Provider  aspirin 81 MG tablet Take 81 mg by mouth daily.   Yes Historical Provider, MD  Cholecalciferol (VITAMIN D3) 5000 UNITS CAPS Take by mouth daily.   Yes Historical Provider, MD  citalopram (CELEXA) 20 MG tablet Take 20 mg by mouth daily.   Yes Historical Provider, MD  cyanocobalamin 1000 MCG tablet Take 100 mcg by  mouth daily.   Yes Historical Provider, MD  folic acid (FOLVITE) 800 MCG tablet Take 400 mcg by mouth daily.   Yes Historical Provider, MD  Multiple Vitamins-Minerals (MULTIVITAMIN WITH MINERALS) tablet Take 1 tablet by mouth daily.   Yes Historical Provider, MD  oxyCODONE (OXY IR/ROXICODONE) 5 MG immediate release tablet Take 1 tablet (5 mg total) by mouth every 4 (four) hours as needed for moderate pain. 06/03/16  Yes Wyatt Haste, MD  pyridOXINE (VITAMIN B-6) 100 MG tablet Take 100 mg by mouth daily.   Yes Historical Provider, MD  simvastatin (ZOCOR) 40 MG tablet Take 40 mg by mouth daily.   Yes Historical Provider, MD  valACYclovir (VALTREX) 500 MG tablet Take 1,000 mg by mouth 3 (three) times daily.   Yes Historical Provider, MD    No Known Allergies  Family History  Problem Relation Age of Onset  . Alcoholism Father   . Heart disease Brother     Social History Social History  Substance Use Topics  . Smoking status: Never Smoker  . Smokeless tobacco: Never Used  . Alcohol use No    Review of Systems Limited due to altered mental status, but these are the answers available   No reported chest pain, trouble breathing, cough, fever, vomiting or diarrhea. ____________________________________________   PHYSICAL EXAM:  VITAL SIGNS: ED Triage Vitals  Enc Vitals Group     BP 09/12/16 0928 (!) 133/95     Pulse Rate 09/12/16 0928 (!) 101  Resp 09/12/16 0928 18     Temp 09/12/16 0928 97.8 F (36.6 C)     Temp Source 09/12/16 0928 Rectal     SpO2 09/12/16 0928 100 %     Weight 09/12/16 0929 156 lb 14.4 oz (71.2 kg)     Height 09/12/16 0929 5\' 5"  (1.651 m)     Head Circumference --      Peak Flow --      Pain Score --      Pain Loc --      Pain Edu? --      Excl. in GC? --      Constitutional: Alert and Cooperative, but disoriented. Well appearing and in no distress. HEENT   Head: Normocephalic and atraumatic.      Eyes: Conjunctivae are normal. PERRL. Normal  extraocular movements.      Ears:         Nose: No congestion/rhinnorhea.   Mouth/Throat: Mucous membranes are mildly dry.   Neck: No stridor. Cardiovascular/Chest: Slightly tachycardic, regular rhythm.  No murmurs, rubs, or gallops. Respiratory: Normal respiratory effort without tachypnea nor retractions. Breath sounds are clear and equal bilaterally. No wheezes/rales/rhonchi. Gastrointestinal: Soft. No distention, no guarding, no rebound. Nontender.  Obese  Genitourinary/rectal:Deferred Musculoskeletal: Nontender with normal range of motion in all extremities. No joint effusions.  No lower extremity tenderness.  No edema. Neurologic:  No facial droop. She is able to follow commands. Her speech is a little slow and she is confused. Seems to be having some tremor in arms and legs, but moving 4 extremities without evidence of focal weakness or numbness. Skin:  Skin is warm, dry and intact. No rash noted. Psychiatric: No agitation.   ____________________________________________  LABS (pertinent positives/negatives)  Labs Reviewed  BASIC METABOLIC PANEL - Abnormal; Notable for the following:       Result Value   Glucose, Bld 126 (*)    BUN 49 (*)    Creatinine, Ser 4.60 (*)    GFR calc non Af Amer 8 (*)    GFR calc Af Amer 10 (*)    All other components within normal limits  CBC - Abnormal; Notable for the following:    WBC 12.2 (*)    RDW 16.3 (*)    All other components within normal limits  URINALYSIS COMPLETEWITH MICROSCOPIC ([facility] ONLY) - Abnormal; Notable for the following:    Color, Urine YELLOW (*)    APPearance CLOUDY (*)    Hgb urine dipstick 2+ (*)    Protein, ur 100 (*)    Leukocytes, UA 3+ (*)    Bacteria, UA MANY (*)    Squamous Epithelial / LPF 0-5 (*)    All other components within normal limits  URINE CULTURE  CULTURE, BLOOD (ROUTINE X 2)  CULTURE, BLOOD (ROUTINE X 2)  LACTIC ACID, PLASMA  LACTIC ACID, PLASMA     ____________________________________________    EKG I, Governor Rooksebecca Micharl Helmes, MD, the attending physician have personally viewed and interpreted all ECGs.  102 bpm. Sinus tachycardia. Nonspecific interventricular conduction delay. Normal axis. Nonspecific ST and T-wave ____________________________________________  RADIOLOGY All Xrays were viewed by me. Imaging interpreted by Radiologist.  CT head without contrast:  IMPRESSION: Areas of old infarcts in the right frontal, right parietal and right occipital lobes.  Stable right ventricular shunt. No hydrocephalus.  Chronic small vessel ischemic changes, stable.  No acute intracranial abnormality. __________________________________________  PROCEDURES  Procedure(s) performed: None  Critical Care performed: CRITICAL CARE Performed by: Governor RooksLORD, Tre Sanker  Total critical care time: 30 minutes  Critical care time was exclusive of separately billable procedures and treating other patients.  Critical care was necessary to treat or prevent imminent or life-threatening deterioration.  Critical care was time spent personally by me on the following activities: development of treatment plan with patient and/or surrogate as well as nursing, discussions with consultants, evaluation of patient's response to treatment, examination of patient, obtaining history from patient or surrogate, ordering and performing treatments and interventions, ordering and review of laboratory studies, ordering and review of radiographic studies, pulse oximetry and re-evaluation of patient's condition.   ____________________________________________   ED COURSE / ASSESSMENT AND PLAN  Pertinent labs & imaging results that were available during my care of the patient were reviewed by me and considered in my medical decision making (see chart for details).   Ms. Engelson is here with altered mental status, mostly confusion and on exam no focal neurologic  deficits appreciated. She is tachycardic here, and on laboratory studies she was found to have positive urinary tract infection. Given the tachycardia and the UTI with the altered mental status, I placed her on the sepsis pathway. She is receiving IV fluid resuscitation, no hypotension. Lactate is pending. Patient was given Rocephin to cover for urinary tract infection.  She was also found to have acute renal failure. No history of prior renal failure.  I discussed this all with the patient's daughter and need for hospital admission.   CT scan without acute findings. I suspect altered mental status is due to the renal failure and UTI. Lactate pending at time of hospitalist consultation for admission.    CONSULTATIONS:   Hospitalist for admission.   Patient / Family / Caregiver informed of clinical course, medical decision-making process, and agree with plan.   ___________________________________________   FINAL CLINICAL IMPRESSION(S) / ED DIAGNOSES   Final diagnoses:  Acute renal failure, unspecified acute renal failure type (HCC)  Urinary tract infection with hematuria, site unspecified  Sepsis, due to unspecified organism (HCC)  Altered mental status, unspecified altered mental status type              Note: This dictation was prepared with Dragon dictation. Any transcriptional errors that result from this process are unintentional    Governor Rooks, MD 09/12/16 1207

## 2016-09-12 NOTE — ED Triage Notes (Signed)
Pt comes into the ED via EMS from [facility], staff reports pt having a period of non verbal lasting about 1-2 min only responding to sternal rub.. States pt has a hx of stroke and uti .Holly York. Just finished a course of valtrex for shingles 10/23.the patient is alert on arrival but is not verbal..

## 2016-09-12 NOTE — ED Notes (Signed)
Patient transported to Ultrasound 

## 2016-09-12 NOTE — Consult Note (Signed)
[facility]  CONSULT NOTE    Date: 09/12/2016                  Patient Name:  Holly York  MRN: [id number]  DOB: 1935/12/24  Age / Sex: 80 y.o., female         PCP: Mickey Farber, MD                 Service Requesting Consult: Dr. Winona Legato                 Reason for Consult: Acute Renal Failure            History of Present Illness: Holly York is a 80 y.o. white female with CVA, hyperlipidemia, hypertension, coronary artery disease, anxiety , brain aneurysm who was admitted to [facility] on 09/12/2016 for Acute renal failure (ARF) (HCC) [N17.9] Sepsis, due to unspecified organism (HCC) [A41.9] Urinary tract infection with hematuria, site unspecified [N39.0, R31.9] Acute renal failure, unspecified acute renal failure type (HCC) [N17.9] Altered mental status, unspecified altered mental status type [R41.82]   Patient is unable to give a history. History taken from chart. Confused for one day. Brought to the ED where she was found to have urinary tract infection and acute renal failure. She was diagnosed with sepsis and admitted.   Baseline creatinine of 0.85 from 06/01/16   Medications: Outpatient medications: Prescriptions Prior to Admission  Medication Sig Dispense Refill Last Dose  . aspirin 81 MG tablet Take 81 mg by mouth daily.   09/11/2016 at 0800  . Cholecalciferol (VITAMIN D3) 5000 UNITS CAPS Take by mouth daily.   09/11/2016 at 08000  . citalopram (CELEXA) 20 MG tablet Take 20 mg by mouth daily.   09/11/2016 at 0800  . cyanocobalamin 1000 MCG tablet Take 100 mcg by mouth daily.   09/11/2016 at 0800  . folic acid (FOLVITE) 800 MCG tablet Take 400 mcg by mouth daily.   09/11/2016 at 0800  . Multiple Vitamins-Minerals (MULTIVITAMIN WITH MINERALS) tablet Take 1 tablet by mouth daily.   09/11/2016 at 0800  . oxyCODONE (OXY IR/ROXICODONE) 5 MG immediate release tablet Take 1 tablet (5 mg total) by mouth every 4 (four) hours as needed for  moderate pain. 15 tablet 0 prn at prn  . pyridOXINE (VITAMIN B-6) 100 MG tablet Take 100 mg by mouth daily.   09/11/2016 at 0800  . simvastatin (ZOCOR) 40 MG tablet Take 40 mg by mouth daily.   09/09/2016 at 1700  . valACYclovir (VALTREX) 500 MG tablet Take 1,000 mg by mouth 3 (three) times daily.   09/11/2016 at 2000    Current medications: Current Facility-Administered Medications  Medication Dose Route Frequency Provider Last Rate Last Dose  . 0.9 %  sodium chloride infusion   Intravenous Continuous Katharina Caper, MD      . aspirin EC tablet 81 mg  81 mg Oral Daily Katharina Caper, MD      . bisacodyl (DULCOLAX) suppository 10 mg  10 mg Rectal Daily PRN Katharina Caper, MD      . Melene Muller ON 09/13/2016] cefTRIAXone (ROCEPHIN) IVPB 1 g  1 g Intravenous Q24H Katharina Caper, MD      . citalopram (CELEXA) tablet 20 mg  20 mg Oral Daily Katharina Caper, MD      . folic acid (FOLVITE) tablet 0.5 mg  500 mcg Oral Daily Katharina Caper, MD      . heparin injection 5,000 Units  5,000 Units Subcutaneous Q8H  Katharina Caperima Vaickute, MD      . Melene Muller[START ON 09/13/2016] multivitamin with minerals tablet 1 tablet  1 tablet Oral Daily Katharina Caperima Vaickute, MD      . ondansetron (ZOFRAN) tablet 4 mg  4 mg Oral Q6H PRN Katharina Caperima Vaickute, MD       Or  . ondansetron (ZOFRAN) injection 4 mg  4 mg Intravenous Q6H PRN Katharina Caperima Vaickute, MD      . Melene Muller[START ON 09/13/2016] pyridOXINE (VITAMIN B-6) tablet 100 mg  100 mg Oral Daily Katharina Caperima Vaickute, MD      . Melene Muller[START ON 09/13/2016] simvastatin (ZOCOR) tablet 40 mg  40 mg Oral QHS Katharina Caperima Vaickute, MD      . Melene Muller[START ON 09/13/2016] vitamin B-12 (CYANOCOBALAMIN) tablet 100 mcg  100 mcg Oral Daily Katharina Caperima Vaickute, MD      . Melene Muller[START ON 09/13/2016] Vitamin D3 CAPS 5,000 Units  1 capsule Oral Daily Katharina Caperima Vaickute, MD          Allergies: No Known Allergies    Past Medical History: Past Medical History:  Diagnosis Date  . Anxiety   . Heart attack   . Hypercholesterolemia   . Stroke [facility](HCC)      Past Surgical  History: Past Surgical History:  Procedure Laterality Date  . BRAIN SURGERY    . CORONARY ARTERY BYPASS GRAFT       Family History: Family History  Problem Relation Age of Onset  . Alcoholism Father   . Heart disease Brother      Social History: Social History   Social History  . Marital status: Widowed    Spouse name: N/A  . Number of children: N/A  . Years of education: N/A   Occupational History  . Not on file.   Social History Main Topics  . Smoking status: Never Smoker  . Smokeless tobacco: Never Used  . Alcohol use No  . Drug use: No  . Sexual activity: Not Currently   Other Topics Concern  . Not on file   Social History Narrative  . No narrative on file     Review of Systems: Review of Systems  Unable to perform ROS: Acuity of condition    Vital Signs: Blood pressure 94/74, pulse 96, temperature 98.2 F (36.8 C), temperature source Oral, resp. rate 18, height 5\' 5"  (1.651 m), weight 71.2 kg (156 lb 14.4 oz), SpO2 95 %.  Weight trends: Filed Weights   09/12/16 0929  Weight: 71.2 kg (156 lb 14.4 oz)    Physical Exam: General: Ill appearing, laying in bed   Head: Normocephalic, atraumatic. Moist oral mucosal membranes  Eyes: Anicteric, PERRL  Neck: Supple, trachea midline  Lungs:  Clear to auscultation  Heart: Regular rate and rhythm  Abdomen:  Soft, nontender  Extremities:  no peripheral edema.  Neurologic: obtunded  Skin: No lesions        Lab results: Basic Metabolic Panel:  Recent Labs Lab 09/12/16 0933  NA 142  K 4.1  CL 107  CO2 22  GLUCOSE 126*  BUN 49*  CREATININE 4.60*  CALCIUM 9.5    Liver Function Tests: No results for input(s): AST, ALT, ALKPHOS, BILITOT, PROT, ALBUMIN in the last 168 hours. No results for input(s): LIPASE, AMYLASE in the last 168 hours. No results for input(s): AMMONIA in the last 168 hours.  CBC:  Recent Labs Lab 09/12/16 0933  WBC 12.2*  HGB 13.2  HCT 39.2  MCV 87.0  PLT 230     Cardiac Enzymes: No results  for input(s): CKTOTAL, CKMB, CKMBINDEX, TROPONINI in the last 168 hours.  BNP: Invalid input(s): POCBNP  CBG: No results for input(s): GLUCAP in the last 168 hours.  Microbiology: Results for orders placed or performed during the hospital encounter of 06/01/16  Urine culture     Status: Abnormal   Collection Time: 06/01/16  1:04 PM  Result Value Ref Range Status   Specimen Description URINE, RANDOM  Final   Special Requests NONE  Final   Culture MULTIPLE SPECIES PRESENT, SUGGEST RECOLLECTION (A)  Final   Report Status 06/03/2016 FINAL  Final  Culture, blood (routine x 2)     Status: None   Collection Time: 06/01/16  4:07 PM  Result Value Ref Range Status   Specimen Description BLOOD LEFT ASSIST CONTROL  Final   Special Requests BOTTLES DRAWN AEROBIC AND ANAEROBIC 5CC  Final   Culture NO GROWTH 5 DAYS  Final   Report Status 06/06/2016 FINAL  Final  Culture, blood (routine x 2)     Status: None   Collection Time: 06/01/16  4:07 PM  Result Value Ref Range Status   Specimen Description BLOOD RIGHT ASSIST CONTROL  Final   Special Requests   Final    BOTTLES DRAWN AEROBIC AND ANAEROBIC AEROBIC 5 CC, ANAEROBIC 1 CC   Culture NO GROWTH 5 DAYS  Final   Report Status 06/06/2016 FINAL  Final    Coagulation Studies: No results for input(s): LABPROT, INR in the last 72 hours.  Urinalysis:  Recent Labs  09/12/16 0933  COLORURINE YELLOW*  LABSPEC 1.006  PHURINE 6.0  GLUCOSEU NEGATIVE  HGBUR 2+*  BILIRUBINUR NEGATIVE  KETONESUR NEGATIVE  PROTEINUR 100*  NITRITE NEGATIVE  LEUKOCYTESUR 3+*      Imaging: Ct Head Wo Contrast  Result Date: 09/12/2016 CLINICAL DATA:  Altered mental status. EXAM: CT HEAD WITHOUT CONTRAST TECHNIQUE: Contiguous axial images were obtained from the base of the skull through the vertex without intravenous contrast. COMPARISON:  06/01/2016 FINDINGS: Brain: Would infarcts in the right cerebellar hemisphere. Ventricular  shunt noted in the right frontal region terminating in the mid right lateral ventricle or possibly third ventricle. Extensive chronic small vessel disease changes throughout the deep white matter. Old infarcts in the right frontal and posterior right parietal lobes. No acute infarction, hemorrhage or hydrocephalus. Vascular: No hyperdense vessel or unexpected calcification. Skull: Normal. Negative for fracture or focal lesion. Sinuses/Orbits: Visualized paranasal sinuses and mastoids clear. Orbital soft tissues unremarkable. Other: None IMPRESSION: Areas of old infarcts in the right frontal, right parietal and right occipital lobes. Stable right ventricular shunt.  No hydrocephalus. Chronic small vessel ischemic changes, stable. No acute intracranial abnormality. Electronically Signed   By: Charlett Nose M.D.   On: 09/12/2016 11:05   US Renal  Result Date: 09/12/2016 CLINICAL DATA:  Acute renal failure. EXAM: RENAL / URINARY TRACT ULTRASOUND COMPLETE COMPARISON:  None. FINDINGS: Right Kidney: Length: 10.1 cm. Normal renal cortical thickness and echogenicity without focal lesions or hydronephrosis. Left Kidney: Length: 10.8 cm. Normal renal cortical thickness and echogenicity without focal lesions or hydronephrosis. Bladder: Normal IMPRESSION: Normal renal ultrasound examination.  No hydronephrosis. Electronically Signed   By: Rudie Meyer M.D.   On: 09/12/2016 14:14      Assessment & Plan: Ms. Zola Runion is a 80 y.o. white female with CVA, hyperlipidemia, hypertension, coronary artery disease, anxiety , brain aneurysm who was admitted to [facility] on 09/12/2016 for   1. Acute Renal Failure: with baseline creatinine of 0.85. Acute renal  failure from urinary tract infection, hypotension, sepsis and poor PO intake. Ultrasound without obstruction - Agree with IV fluids: NS ordered - renally dose all medications - monitor urine output, renal function, volume status and electrolytes.   2. Urinary  tract infection: empiric ceftriaxone - urine culture sent.   3. Hypotension and sepsis: hypovolumic on examination.  - hold blood pressure medications.     LOS: 0 Mikiah Durall 10/24/20174:37 PM

## 2016-09-12 NOTE — ED Notes (Signed)
Pt states understanding of discharge instructions. NAD noted at this time.  

## 2016-09-13 LAB — BLOOD CULTURE ID PANEL (REFLEXED)
ACINETOBACTER BAUMANNII: NOT DETECTED
CANDIDA KRUSEI: NOT DETECTED
Candida albicans: NOT DETECTED
Candida glabrata: NOT DETECTED
Candida parapsilosis: NOT DETECTED
Candida tropicalis: NOT DETECTED
ENTEROCOCCUS SPECIES: NOT DETECTED
ESCHERICHIA COLI: NOT DETECTED
Enterobacter cloacae complex: NOT DETECTED
Enterobacteriaceae species: NOT DETECTED
HAEMOPHILUS INFLUENZAE: NOT DETECTED
Klebsiella oxytoca: NOT DETECTED
Klebsiella pneumoniae: NOT DETECTED
LISTERIA MONOCYTOGENES: NOT DETECTED
METHICILLIN RESISTANCE: DETECTED — AB
NEISSERIA MENINGITIDIS: NOT DETECTED
PSEUDOMONAS AERUGINOSA: NOT DETECTED
Proteus species: NOT DETECTED
SERRATIA MARCESCENS: NOT DETECTED
STAPHYLOCOCCUS AUREUS BCID: NOT DETECTED
STREPTOCOCCUS AGALACTIAE: NOT DETECTED
STREPTOCOCCUS PNEUMONIAE: NOT DETECTED
STREPTOCOCCUS SPECIES: NOT DETECTED
Staphylococcus species: DETECTED — AB
Streptococcus pyogenes: NOT DETECTED

## 2016-09-13 LAB — BASIC METABOLIC PANEL
Anion gap: 13 (ref 5–15)
BUN: 51 mg/dL — AB (ref 6–20)
CHLORIDE: 115 mmol/L — AB (ref 101–111)
CO2: 18 mmol/L — AB (ref 22–32)
CREATININE: 4.61 mg/dL — AB (ref 0.44–1.00)
Calcium: 8.4 mg/dL — ABNORMAL LOW (ref 8.9–10.3)
GFR calc non Af Amer: 8 mL/min — ABNORMAL LOW (ref >60)
GFR, EST AFRICAN AMERICAN: 10 mL/min — AB (ref >60)
Glucose, Bld: 111 mg/dL — ABNORMAL HIGH (ref 65–99)
Potassium: 3.9 mmol/L (ref 3.5–5.1)
Sodium: 146 mmol/L — ABNORMAL HIGH (ref 135–145)

## 2016-09-13 LAB — GLUCOSE, CAPILLARY
GLUCOSE-CAPILLARY: 107 mg/dL — AB (ref 65–99)
Glucose-Capillary: 105 mg/dL — ABNORMAL HIGH (ref 65–99)
Glucose-Capillary: 100 mg/dL — ABNORMAL HIGH (ref 65–99)

## 2016-09-13 LAB — TROPONIN I: Troponin I: 0.03 ng/mL (ref <0.03)

## 2016-09-13 LAB — CBC
HCT: 36.4 % (ref 35.0–47.0)
Hemoglobin: 12 g/dL (ref 12.0–16.0)
MCH: 29.3 pg (ref 26.0–34.0)
MCHC: 33 g/dL (ref 32.0–36.0)
MCV: 88.9 fL (ref 80.0–100.0)
PLATELETS: 241 10*3/uL (ref 150–440)
RBC: 4.09 MIL/uL (ref 3.80–5.20)
RDW: 16.4 % — ABNORMAL HIGH (ref 11.5–14.5)
WBC: 10.6 10*3/uL (ref 3.6–11.0)

## 2016-09-13 LAB — HEMOGLOBIN A1C
Hgb A1c MFr Bld: 5.7 % — ABNORMAL HIGH (ref 4.8–5.6)
Mean Plasma Glucose: 117 mg/dL

## 2016-09-13 MED ORDER — LORAZEPAM 2 MG/ML IJ SOLN
0.5000 mg | Freq: Once | INTRAMUSCULAR | Status: AC
  Administered 2016-09-13: 0.5 mg via INTRAVENOUS
  Filled 2016-09-13: qty 1

## 2016-09-13 MED ORDER — SODIUM CHLORIDE 0.9 % IV SOLN
INTRAVENOUS | Status: DC
  Administered 2016-09-13 – 2016-09-14 (×3): via INTRAVENOUS

## 2016-09-13 NOTE — Progress Notes (Signed)
PHARMACY - PHYSICIAN COMMUNICATION CRITICAL VALUE ALERT - BLOOD CULTURE IDENTIFICATION (BCID)  Results for orders placed or performed during the hospital encounter of 09/12/16  Blood Culture ID Panel (Reflexed) (Collected: 09/12/2016 11:11 AM)  Result Value Ref Range   Enterococcus species NOT DETECTED NOT DETECTED   Listeria monocytogenes NOT DETECTED NOT DETECTED   Staphylococcus species DETECTED (A) NOT DETECTED   Staphylococcus aureus NOT DETECTED NOT DETECTED   Methicillin resistance DETECTED (A) NOT DETECTED   Streptococcus species NOT DETECTED NOT DETECTED   Streptococcus agalactiae NOT DETECTED NOT DETECTED   Streptococcus pneumoniae NOT DETECTED NOT DETECTED   Streptococcus pyogenes NOT DETECTED NOT DETECTED   Acinetobacter baumannii NOT DETECTED NOT DETECTED   Enterobacteriaceae species NOT DETECTED NOT DETECTED   Enterobacter cloacae complex NOT DETECTED NOT DETECTED   Escherichia coli NOT DETECTED NOT DETECTED   Klebsiella oxytoca NOT DETECTED NOT DETECTED   Klebsiella pneumoniae NOT DETECTED NOT DETECTED   Proteus species NOT DETECTED NOT DETECTED   Serratia marcescens NOT DETECTED NOT DETECTED   Haemophilus influenzae NOT DETECTED NOT DETECTED   Neisseria meningitidis NOT DETECTED NOT DETECTED   Pseudomonas aeruginosa NOT DETECTED NOT DETECTED   Candida albicans NOT DETECTED NOT DETECTED   Candida glabrata NOT DETECTED NOT DETECTED   Candida krusei NOT DETECTED NOT DETECTED   Candida parapsilosis NOT DETECTED NOT DETECTED   Candida tropicalis NOT DETECTED NOT DETECTED    Name of physician (or Provider) Contacted: Vachhani  Changes to prescribed antibiotics required: MD would like to continue Rocephin since BCID likely a contaminant.   Brysun Eschmann D 09/13/2016  2:49 PM

## 2016-09-13 NOTE — Progress Notes (Signed)
[employer] [facility] [place] [facility]   PATIENT NAME: Holly York    MR#:  [id number]  DATE OF BIRTH:  [birth year]  SUBJECTIVE:  CHIEF COMPLAINT:   Chief Complaint  Patient presents with  . Loss of Consciousness     Came with weakness, Found to have UTI and ARF. She was recently treated for Schingles. REVIEW OF SYSTEMS:   Patient is in altered mental status and appears slightly agitated so not able to give me any review of system. ROS  DRUG ALLERGIES:  No Known Allergies  VITALS:  Blood pressure (!) 190/66, pulse 98, temperature 97.7 F (36.5 C), temperature source Oral, resp. rate 19, height 5\' 1"  (1.549 m), weight 68.3 kg (150 lb 8 oz), SpO2 (!) 88 %.  PHYSICAL EXAMINATION:  GENERAL:  80 y.o.-year-old patient lying in the bed with no acute distress. Appears slightly agitated and confused. EYES: Pupils equal, round, reactive to light and accommodation. No scleral icterus. Extraocular muscles intact.  HEENT: Head atraumatic, normocephalic. Oropharynx and nasopharynx clear.  NECK:  Supple, no jugular venous distention. No thyroid enlargement, no tenderness.  LUNGS: Normal breath sounds bilaterally, no wheezing, rales,rhonchi or crepitation. No use of accessory muscles of respiration.  CARDIOVASCULAR: S1, S2 normal. No murmurs, rubs, or gallops.  ABDOMEN: Soft, nontender, nondistended. Bowel sounds present. No organomegaly or mass.  EXTREMITIES: No pedal edema, cyanosis, or clubbing.  NEUROLOGIC: Cranial nerves II through XII are intact. Muscle strength 3-4/5 in all extremities. Sensation intact. Gait not checked. She moves all 4 limbs spontaneously but not following commands because of confusion. PSYCHIATRIC: The patient is alert and confused.  SKIN: No obvious rash, lesion, or ulcer.   Physical Exam LABORATORY PANEL:   CBC  Recent Labs Lab 09/13/16 0415  WBC 10.6  HGB 12.0  HCT 36.4  PLT 241    ------------------------------------------------------------------------------------------------------------------  Chemistries   Recent Labs Lab 09/13/16 0415  NA 146*  K 3.9  CL 115*  CO2 18*  GLUCOSE 111*  BUN 51*  CREATININE 4.61*  CALCIUM 8.4*   ------------------------------------------------------------------------------------------------------------------  Cardiac Enzymes  Recent Labs Lab 09/12/16 2139 09/13/16 0415  TROPONINI 0.03* 0.03*   ------------------------------------------------------------------------------------------------------------------  RADIOLOGY:  Ct Head Wo Contrast  Result Date: 09/12/2016 CLINICAL DATA:  Altered mental status. EXAM: CT HEAD WITHOUT CONTRAST TECHNIQUE: Contiguous axial images were obtained from the base of the skull through the vertex without intravenous contrast. COMPARISON:  06/01/2016 FINDINGS: Brain: Would infarcts in the right cerebellar hemisphere. Ventricular shunt noted in the right frontal region terminating in the mid right lateral ventricle or possibly third ventricle. Extensive chronic small vessel disease changes throughout the deep white matter. Old infarcts in the right frontal and posterior right parietal lobes. No acute infarction, hemorrhage or hydrocephalus. Vascular: No hyperdense vessel or unexpected calcification. Skull: Normal. Negative for fracture or focal lesion. Sinuses/Orbits: Visualized paranasal sinuses and mastoids clear. Orbital soft tissues unremarkable. Other: None IMPRESSION: Areas of old infarcts in the right frontal, right parietal and right occipital lobes. Stable right ventricular shunt.  No hydrocephalus. Chronic small vessel ischemic changes, stable. No acute intracranial abnormality. Electronically Signed   By: Charlett NoseKevin  Dover M.D.   On: 09/12/2016 11:05   [place] Renal  Result Date: 09/12/2016 CLINICAL DATA:  Acute renal failure. EXAM: RENAL / URINARY TRACT ULTRASOUND COMPLETE COMPARISON:  None.  FINDINGS: Right Kidney: Length: 10.1 cm. Normal renal cortical thickness and echogenicity without focal lesions or hydronephrosis. Left Kidney: Length: 10.8 cm. Normal renal cortical thickness and echogenicity without focal  lesions or hydronephrosis. Bladder: Normal IMPRESSION: Normal renal ultrasound examination.  No hydronephrosis. Electronically Signed   By: Rudie Meyer M.D.   On: 09/12/2016 14:14    ASSESSMENT AND PLAN:   Principal Problem:   Acute renal failure (ARF) (HCC) Active Problems:   Sepsis (HCC)   UTI (urinary tract infection)   Hypotension   Encephalopathy acute   Hyperglycemia  #1. Acute renal failure,    IV fluids, follow creatinine , appreciated nephrologist recommendations, reviewed renal ultrasound   As per daughter if renal function does not improve enough until tomorrow then she would like to make her comfort care.   Part of her renal failure could also be secondary to " Shingle treatment" recently she received. #2. Urinary tract infection, and urinary, blood cultures, initiate Rocephin, adjust antibiotics depending on culture results #3 hypotension, IV fluids at high rate #4 acute encephalopathy, follow clinically, neuro checks every few hours. Speech therapist evaluation, RN to do bedside swallowing evaluation, IV fluids for now. Place Foley catheter #5. Hyperglycemia, monitor.    All the records are reviewed and case discussed with Care Management/Social Workerr. Management plans discussed with the patient, family and they are in agreement.  CODE STATUS: DNR  TOTAL TIME TAKING CARE OF THIS PATIENT: 35 minutes.   Discussed with her daughter in the room and nephrologist.  POSSIBLE D/C IN 1-2 DAYS, DEPENDING ON CLINICAL CONDITION.   Altamese Dilling M.D on 09/13/2016   Between 7am to 6pm - Pager - [phone]  After 6pm go to [email or web address] - password [email or web address]  [facility]  Office  [phone]  CC: Primary care physician;  Mickey Farber, MD  Note: This dictation was prepared with Dragon dictation along with smaller phrase technology. Any transcriptional errors that result from this process are unintentional.

## 2016-09-13 NOTE — Progress Notes (Signed)
Patient was restless and agitated, pulled out her iv catheter. Attending made aware, new order in place. Patient remains NPO unable to perform bedside swallowing eval, patient cannot follow command. Will continue to evaluate.

## 2016-09-13 NOTE — NC FL2 (Signed)
[place] MEDICAID FL2 LEVEL OF CARE SCREENING TOOL     IDENTIFICATION  Patient Name: Holly York Birthdate: 1936/07/04 Sex: female Admission Date (Current Location): 09/12/2016  [place]   Facility and Address:  [facility], [address], [place], [place] [address]      Provider Number: [id number]  Attending Physician Name and Address:  Altamese DillingVaibhavkumar Vachhani, MD  Relative Name and Phone Number:  Cena BentonHursey,Colleen Daughter [phone]     Current Level of Care: Hospital Recommended Level of Care: Nursing Facility Prior Approval Number:    Date Approved/Denied:   PASRR Number: [id number]  Discharge Plan: SNF    Current Diagnoses: Patient Active Problem List   Diagnosis Date Noted  . Sepsis (HCC) 09/12/2016  . UTI (urinary tract infection) 09/12/2016  . Hypotension 09/12/2016  . Encephalopathy acute 09/12/2016  . Acute renal failure (ARF) (HCC) 09/12/2016  . Hyperglycemia 09/12/2016  . UTI (lower urinary tract infection) 06/01/2016  . Left hip pain 05/29/2015  . CAD (coronary artery disease) 05/29/2015  . Fall at home 05/29/2015    Orientation RESPIRATION BLADDER Height & Weight        Normal Incontinent Weight: 150 lb 8 oz (68.3 kg) Height:  5\' 1"  (154.9 cm)  BEHAVIORAL SYMPTOMS/MOOD NEUROLOGICAL BOWEL NUTRITION STATUS      Incontinent Diet  AMBULATORY STATUS COMMUNICATION OF NEEDS Skin   Extensive Assist Verbally Normal                       Personal Care Assistance Level of Assistance  Bathing, Feeding, Dressing Bathing Assistance: Maximum assistance Feeding assistance: Maximum assistance Dressing Assistance: Maximum assistance     Functional Limitations Info  Sight, Hearing, Speech Sight Info: Adequate Hearing Info: Adequate Speech Info: Impaired    SPECIAL CARE FACTORS FREQUENCY                       Contractures Contractures Info: Not present    Additional  Factors Info  Code Status, Allergies, Psychotropic Code Status Info: DNR Allergies Info: NKA Psychotropic Info: citalopram (CELEXA) tablet 20 mg         Current Medications (09/13/2016):  This is the current hospital active medication list Current Facility-Administered Medications  Medication Dose Route Frequency Provider Last Rate Last Dose  . 0.9 %  sodium chloride infusion   Intravenous Continuous Lamont DowdySarath Kolluru, MD 75 mL/hr at 09/13/16 1145    . aspirin EC tablet 81 mg  81 mg Oral Daily Katharina Caperima Vaickute, MD      . bisacodyl (DULCOLAX) suppository 10 mg  10 mg Rectal Daily PRN Katharina Caperima Vaickute, MD      . cefTRIAXone (ROCEPHIN) IVPB 1 g  1 g Intravenous Q24H Katharina Caperima Vaickute, MD   1 g at 09/13/16 1059  . citalopram (CELEXA) tablet 20 mg  20 mg Oral Daily Katharina Caperima Vaickute, MD      . folic acid (FOLVITE) tablet 0.5 mg  500 mcg Oral Daily Katharina Caperima Vaickute, MD      . heparin injection 5,000 Units  5,000 Units Subcutaneous Q8H Katharina Caperima Vaickute, MD   5,000 Units at 09/13/16 1543  . LORazepam (ATIVAN) injection 0.5 mg  0.5 mg Intravenous Once Altamese DillingVaibhavkumar Vachhani, MD      . multivitamin with minerals tablet 1 tablet  1 tablet Oral Daily Katharina Caperima Vaickute, MD      . ondansetron (ZOFRAN) tablet 4 mg  4 mg Oral Q6H PRN Katharina Caperima Vaickute, MD  Or  . ondansetron (ZOFRAN) injection 4 mg  4 mg Intravenous Q6H PRN Katharina Caper, MD      . pyridOXINE (VITAMIN B-6) tablet 100 mg  100 mg Oral Daily Katharina Caper, MD      . simvastatin (ZOCOR) tablet 40 mg  40 mg Oral QHS Katharina Caper, MD      . vitamin B-12 (CYANOCOBALAMIN) tablet 100 mcg  100 mcg Oral Daily Katharina Caper, MD      . Vitamin D3 CAPS 5,000 Units  1 capsule Oral Daily Katharina Caper, MD         Discharge Medications: Please see discharge summary for a list of discharge medications.  Relevant Imaging Results:  Relevant Lab Results:   Additional Information SSN [id number]  [name]

## 2016-09-13 NOTE — Care Management (Signed)
patient admitted from [facility] where she was under a long term plan of treatment.  She was sent to the ED due to period of unresponsiveness. Being treated for sepsis from uti.  She is being followed by nephrology for the acute renal failure. She is DNR.  Informed during progression that prior to this admission, patient was alert, oriented and ambulatory.  Discussed whether additional neuro work up is indicated.  discussed that it is thought current decrease in cognitive status most likely due to sepsis.  If renal status does not improve it within the next 24 hours, family may consider comfort care.

## 2016-09-13 NOTE — Progress Notes (Signed)
Staff attempted once to insert peripheral iv on patient was unsuccessful. Another staff on the floor attempted x1 and was unsuccessful as well. Attending physician was made aware, new order for double lumens picc line for antibiotics and fluid therapy. Was unable to give ativan order secondary to no iv line. Patient calm at this time, family at bedside, will continue to monitor patient.

## 2016-09-13 NOTE — Progress Notes (Signed)
Patient's daughter at bedside made aware of the order for picc line insertion. Per patient daughter she did not want picc line on patient. Attending made aware, new order to discontinue order for picc line placement. Charge nurse was able to insert a peripheral line will continue antibiotic and fluid therapy. IV ativan administered as ordered.

## 2016-09-13 NOTE — Clinical Social Work Note (Addendum)
MSW met with patient and her family to discuss SNF placement.  Patient is a long term care resident at [facility].  Patient's family would like her to return to facility if she is able to.  MSW to continue to follow patient's progress throughout discharge planning.  Formal assessment to follow.  Jones Broom. Norval Morton, MSW [phone]  Mon-Fri 8a-4:30p 09/13/2016 4:22 PM

## 2016-09-13 NOTE — Progress Notes (Signed)
[facility]  ROUNDING NOTE   Subjective:   Daughter at bedside. She states patient was functioning independently before this admission.   Creatinine 4.61 - no improvement  Objective:  Vital signs in last 24 hours:  Temp:  [97.7 F (36.5 C)-98.2 F (36.8 C)] 97.7 F (36.5 C) (10/25 0425) Pulse Rate:  [55-107] 98 (10/25 1134) Resp:  [18-19] 19 (10/25 1134) BP: (94-196)/(66-125) 190/66 (10/25 1134) SpO2:  [88 %-95 %] 88 % (10/25 1134) FiO2 (%):  [21 %] 21 % (10/24 1543) Weight:  [68 kg (150 lb)-68.3 kg (150 lb 8 oz)] 68.3 kg (150 lb 8 oz) (10/25 0425)  Weight change:  Filed Weights   09/12/16 0929 09/12/16 1531 09/13/16 0425  Weight: 71.2 kg (156 lb 14.4 oz) 68 kg (150 lb) 68.3 kg (150 lb 8 oz)    Intake/Output: I/O last 3 completed shifts: In: 1039.6 [I.V.:1039.6] Out: 0    Intake/Output this shift:  Total I/O In: 485.4 [I.V.:485.4] Out: -   Physical Exam: General: NAD, elderly, frail  Head: Normocephalic, atraumatic. Dry oral mucosal membranes  Eyes: Anicteric, PERRL  Neck: Supple, trachea midline  Lungs:  Clear to auscultation  Heart: Regular rate and rhythm  Abdomen:  Soft, nontender,   Extremities:  trace peripheral edema.  Neurologic: Not following commands  Skin: No lesions       Basic Metabolic Panel:  Recent Labs Lab 09/12/16 0933 09/13/16 0415  NA 142 146*  K 4.1 3.9  CL 107 115*  CO2 22 18*  GLUCOSE 126* 111*  BUN 49* 51*  CREATININE 4.60* 4.61*  CALCIUM 9.5 8.4*    Liver Function Tests: No results for input(s): AST, ALT, ALKPHOS, BILITOT, PROT, ALBUMIN in the last 168 hours. No results for input(s): LIPASE, AMYLASE in the last 168 hours. No results for input(s): AMMONIA in the last 168 hours.  CBC:  Recent Labs Lab 09/12/16 0933 09/13/16 0415  WBC 12.2* 10.6  HGB 13.2 12.0  HCT 39.2 36.4  MCV 87.0 88.9  PLT 230 241    Cardiac Enzymes:  Recent Labs Lab 09/12/16 0933 09/12/16 2139 09/13/16 0415   TROPONINI 0.03* 0.03* 0.03*    BNP: Invalid input(s): POCBNP  CBG:  Recent Labs Lab 09/13/16 0716 09/13/16 1133  GLUCAP 105* 107*    Microbiology: Results for orders placed or performed during the hospital encounter of 09/12/16  Culture, blood (routine x 2)     Status: None (Preliminary result)   Collection Time: 09/12/16 11:11 AM  Result Value Ref Range Status   Specimen Description BLOOD LEFT ARM  Final   Special Requests BOTTLES DRAWN AEROBIC AND ANAEROBIC 7CC  Final   Culture NO GROWTH < 24 HOURS  Final   Report Status PENDING  Incomplete  Culture, blood (routine x 2)     Status: None (Preliminary result)   Collection Time: 09/12/16 11:11 AM  Result Value Ref Range Status   Specimen Description BLOOD RIGHT HAND  Final   Special Requests BOTTLES DRAWN AEROBIC AND ANAEROBIC 4CC  Final   Culture  Setup Time Organism ID to follow  Final   Culture NO GROWTH < 24 HOURS  Final   Report Status PENDING  Incomplete  MRSA PCR Screening     Status: None   Collection Time: 09/12/16  4:15 PM  Result Value Ref Range Status   MRSA by PCR NEGATIVE NEGATIVE Final    Comment:        The GeneXpert MRSA Assay (FDA approved for NASAL  specimens only), is one component of a comprehensive MRSA colonization surveillance program. It is not intended to diagnose MRSA infection nor to guide or monitor treatment for MRSA infections.     Coagulation Studies: No results for input(s): LABPROT, INR in the last 72 hours.  Urinalysis:  Recent Labs  09/12/16 0933  COLORURINE YELLOW*  LABSPEC 1.006  PHURINE 6.0  GLUCOSEU NEGATIVE  HGBUR 2+*  BILIRUBINUR NEGATIVE  KETONESUR NEGATIVE  PROTEINUR 100*  NITRITE NEGATIVE  LEUKOCYTESUR 3+*      Imaging: Ct Head Wo Contrast  Result Date: 09/12/2016 CLINICAL DATA:  Altered mental status. EXAM: CT HEAD WITHOUT CONTRAST TECHNIQUE: Contiguous axial images were obtained from the base of the skull through the vertex without intravenous  contrast. COMPARISON:  06/01/2016 FINDINGS: Brain: Would infarcts in the right cerebellar hemisphere. Ventricular shunt noted in the right frontal region terminating in the mid right lateral ventricle or possibly third ventricle. Extensive chronic small vessel disease changes throughout the deep white matter. Old infarcts in the right frontal and posterior right parietal lobes. No acute infarction, hemorrhage or hydrocephalus. Vascular: No hyperdense vessel or unexpected calcification. Skull: Normal. Negative for fracture or focal lesion. Sinuses/Orbits: Visualized paranasal sinuses and mastoids clear. Orbital soft tissues unremarkable. Other: None IMPRESSION: Areas of old infarcts in the right frontal, right parietal and right occipital lobes. Stable right ventricular shunt.  No hydrocephalus. Chronic small vessel ischemic changes, stable. No acute intracranial abnormality. Electronically Signed   By: Charlett NoseKevin  Dover M.D.   On: 09/12/2016 11:05   [place] Renal  Result Date: 09/12/2016 CLINICAL DATA:  Acute renal failure. EXAM: RENAL / URINARY TRACT ULTRASOUND COMPLETE COMPARISON:  None. FINDINGS: Right Kidney: Length: 10.1 cm. Normal renal cortical thickness and echogenicity without focal lesions or hydronephrosis. Left Kidney: Length: 10.8 cm. Normal renal cortical thickness and echogenicity without focal lesions or hydronephrosis. Bladder: Normal IMPRESSION: Normal renal ultrasound examination.  No hydronephrosis. Electronically Signed   By: Rudie MeyerP.  Gallerani M.D.   On: 09/12/2016 14:14     Medications:   . sodium chloride 75 mL/hr at 09/13/16 1145   . aspirin EC  81 mg Oral Daily  . cefTRIAXone  1 g Intravenous Q24H  . citalopram  20 mg Oral Daily  . folic acid  500 mcg Oral Daily  . heparin  5,000 Units Subcutaneous Q8H  . multivitamin with minerals  1 tablet Oral Daily  . pyridOXINE  100 mg Oral Daily  . simvastatin  40 mg Oral QHS  . cyanocobalamin  100 mcg Oral Daily  . Vitamin D3  1 capsule Oral  Daily   bisacodyl, ondansetron **OR** ondansetron (ZOFRAN) IV  Assessment/ Plan:  Ms. Holly York is a 80 y.o. white female  with CVA, hyperlipidemia, hypertension, coronary artery disease, anxiety , brain aneurysm who was [facility] on 09/12/2016   1. Acute Renal Failure: with baseline creatinine of 0.85. Acute renal failure from urinary tract infection, hypotension, sepsis and poor PO intake. No improvement. Ultrasound without obstruction - Agree with IV fluids: NS ordered, will monitor with metabolic acidosis and hypernatremia - renally dose all medications - monitor urine output, renal function, volume status and electrolytes.   2. Urinary tract infection: empiric ceftriaxone - urine culture sent. Blood cultures with no growth  3. Hypotension and sepsis: blood pressure improving. Holding home medications   LOS: 1 Yashua Bracco 10/25/20171:50 PM

## 2016-09-13 NOTE — Progress Notes (Signed)
SLP Cancellation Note  Patient Details Name: Holly York MRN: [id number] DOB: 08/31/1936   Cancelled treatment:       Reason Eval/Treat Not Completed: Medical issues which prohibited therapy;Patient declined, no reason specified;Patient's level of consciousness;Patient not medically ready (per Daughter, family present; per NSG). ST services attempted x2 today for assessment, however, pt is not appropriate for oral intake at this time and would be at increased risk for aspiration. Family stated pt does have dysphagia baseline and [facility].  ST services will f/u w/ pt's status tomorrow and assess as appropriate. NSG agreed.    Jerilynn SomKatherine Jarvin Ogren, MS, CCC-SLP [name] 09/13/2016, 3:02 PM

## 2016-09-14 ENCOUNTER — Ambulatory Visit: Payer: Self-pay | Admitting: Gastroenterology

## 2016-09-14 ENCOUNTER — Inpatient Hospital Stay: Payer: Medicare Other

## 2016-09-14 DIAGNOSIS — Z515 Encounter for palliative care: Secondary | ICD-10-CM

## 2016-09-14 DIAGNOSIS — N179 Acute kidney failure, unspecified: Secondary | ICD-10-CM

## 2016-09-14 DIAGNOSIS — R4182 Altered mental status, unspecified: Secondary | ICD-10-CM

## 2016-09-14 LAB — BASIC METABOLIC PANEL
Anion gap: 13 (ref 5–15)
BUN: 46 mg/dL — AB (ref 6–20)
CHLORIDE: 117 mmol/L — AB (ref 101–111)
CO2: 18 mmol/L — ABNORMAL LOW (ref 22–32)
Calcium: 8.7 mg/dL — ABNORMAL LOW (ref 8.9–10.3)
Creatinine, Ser: 3.14 mg/dL — ABNORMAL HIGH (ref 0.44–1.00)
GFR calc Af Amer: 15 mL/min — ABNORMAL LOW (ref >60)
GFR, EST NON AFRICAN AMERICAN: 13 mL/min — AB (ref >60)
GLUCOSE: 100 mg/dL — AB (ref 65–99)
POTASSIUM: 3.7 mmol/L (ref 3.5–5.1)
Sodium: 148 mmol/L — ABNORMAL HIGH (ref 135–145)

## 2016-09-14 LAB — GLUCOSE, CAPILLARY: Glucose-Capillary: 94 mg/dL (ref 65–99)

## 2016-09-14 LAB — URINE CULTURE

## 2016-09-14 LAB — T4, FREE: Free T4: 0.99 ng/dL (ref 0.61–1.12)

## 2016-09-14 MED ORDER — GLYCOPYRROLATE 0.2 MG/ML IJ SOLN
0.2000 mg | INTRAMUSCULAR | Status: DC | PRN
  Filled 2016-09-14: qty 1

## 2016-09-14 MED ORDER — LORAZEPAM 2 MG/ML IJ SOLN: 0.5000 mg | INTRAMUSCULAR | Status: DC | PRN

## 2016-09-14 MED ORDER — MORPHINE SULFATE (PF) 2 MG/ML IV SOLN: 1.0000 mg | INTRAVENOUS | Status: DC | PRN

## 2016-09-14 MED ORDER — DEXTROSE-NACL 5-0.45 % IV SOLN
INTRAVENOUS | Status: DC
  Administered 2016-09-14: 09:00:00 via INTRAVENOUS

## 2016-09-14 MED ORDER — MORPHINE SULFATE (PF) 2 MG/ML IV SOLN: 0.5000 mg | INTRAVENOUS | Status: DC | PRN

## 2016-09-14 NOTE — Progress Notes (Signed)
Initial Nutrition Assessment  DOCUMENTATION CODES:   Not applicable  INTERVENTION:  No interventions warranted at this time. Patient has a palliative consult for discussion of GOC. According to RN she has been unresponsive and unarousable for most of her stay. When I visited today I could not wake her, but according to RN patient was agitated and combative last night, was given pain medication. -She may need a SLP eval if she is expected to continue PO intake -Nutrition support not warranted  NUTRITION DIAGNOSIS:   Inadequate oral intake related to lethargy/confusion as evidenced by per patient/family report.  GOAL:   Patient will meet greater than or equal to 90% of their needs  MONITOR:   PO intake, Diet advancement, I & O's, Labs, Weight trends  REASON FOR ASSESSMENT:   Low Braden    ASSESSMENT:   Holly York  is a 80 y.o. 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 Findings are moderate fat depletion, no muscle depletion, and no edema.  Exhibits a 21#/13% insignificant wt loss over 10 months. No family at bedside, pt unarousable. Labs and medications reviewed: Na 148  Diet Order:  Diet NPO time specified  Skin:  Reviewed, no issues  Last BM:  PTA  Height:   Ht Readings from Last 1 Encounters:  09/12/16 5\' 1"  (1.549 m)    Weight:   Wt Readings from Last 1 Encounters:  09/14/16 143 lb 1.6 oz (64.9 kg)    Ideal Body Weight:  47.72 kg  BMI:  Body mass index is 27.04 kg/m.  Estimated Nutritional Needs:   Kcal:  1500-1850 calories  Protein:  65-78 gm  Fluid:  >/= 1.5L  EDUCATION NEEDS:   No education needs identified at this time  Dionne AnoWilliam M. Nicholette Dolson, MS, RD LDN Inpatient Clinical  Dietitian Pager [phone]

## 2016-09-14 NOTE — Progress Notes (Signed)
[facility] - [place] at [facility]   PATIENT NAME: Holly York    MR#:  [id number]  DATE OF BIRTH:  08/28/1936  SUBJECTIVE:  CHIEF COMPLAINT:   Chief Complaint  Patient presents with  . Loss of Consciousness   Patient is confused and drowsy.   REVIEW OF SYSTEMS:   Unobtainable due to confusion Review of Systems  Psychiatric/Behavioral: The patient is nervous/anxious.     DRUG ALLERGIES:  No Known Allergies  VITALS:  Blood pressure (!) 147/98, pulse (!) 110, temperature 98.6 F (37 C), resp. rate (!) 21, height 5\' 1"  (1.549 m), weight 64.9 kg (143 lb 1.6 oz), SpO2 91 %.  PHYSICAL EXAMINATION:  GENERAL:  80 y.o.-year-old patient lying in the bed  EYES: Pupils equal, round, reactive to light and accommodation. No scleral icterus. Extraocular muscles intact.  HEENT: Head atraumatic, normocephalic. Oropharynx and nasopharynx clear.  NECK:  Supple, no jugular venous distention. No thyroid enlargement, no tenderness.  LUNGS: Bilateral coarse breath sounds CARDIOVASCULAR: S1, S2 normal. No murmurs, rubs, or gallops.  ABDOMEN: Soft, nontender, nondistended. Bowel sounds present. No organomegaly or mass.  EXTREMITIES: No pedal edema, cyanosis, or clubbing.  NEUROLOGIC: Moves all 4 extremities PSYCHIATRIC: The patient is drowsy  Physical Exam LABORATORY PANEL:   CBC  Recent Labs Lab 09/13/16 0415  WBC 10.6  HGB 12.0  HCT 36.4  PLT 241   ------------------------------------------------------------------------------------------------------------------  Chemistries   Recent Labs Lab 09/14/16 0446  NA 148*  K 3.7  CL 117*  CO2 18*  GLUCOSE 100*  BUN 46*  CREATININE 3.14*  CALCIUM 8.7*   ------------------------------------------------------------------------------------------------------------------  Cardiac Enzymes  Recent Labs Lab 09/12/16 2139 09/13/16 0415  TROPONINI 0.03* 0.03*    ------------------------------------------------------------------------------------------------------------------  RADIOLOGY:  [place] Renal  Result Date: 09/12/2016 CLINICAL DATA:  Acute renal failure. EXAM: RENAL / URINARY TRACT ULTRASOUND COMPLETE COMPARISON:  None. FINDINGS: Right Kidney: Length: 10.1 cm. Normal renal cortical thickness and echogenicity without focal lesions or hydronephrosis. Left Kidney: Length: 10.8 cm. Normal renal cortical thickness and echogenicity without focal lesions or hydronephrosis. Bladder: Normal IMPRESSION: Normal renal ultrasound examination.  No hydronephrosis. Electronically Signed   By: Rudie MeyerP.  Gallerani M.D.   On: 09/12/2016 14:14   Dg Chest Port 1 View  Result Date: 09/14/2016 CLINICAL DATA:  Hypoxia EXAM: PORTABLE CHEST 1 VIEW COMPARISON:  06/01/2016 FINDINGS: Cardiomediastinal silhouette is stable. Postsurgical changes right hilum again noted. Mild elevation of the right hemidiaphragm. There is right basilar atelectasis or infiltrate. No pulmonary edema. Stable left basilar scarring. IMPRESSION: No pulmonary edema. Right basilar atelectasis or infiltrate. Stable left basilar scarring. Electronically Signed   By: Natasha MeadLiviu  Pop M.D.   On: 09/14/2016 10:02    ASSESSMENT AND PLAN:   Principal Problem:   Acute renal failure (ARF) (HCC) Active Problems:   Sepsis (HCC)   UTI (urinary tract infection)   Hypotension   Encephalopathy acute   Hyperglycemia  #1. Acute renal failure With hypernatremia IV fluids switched to D5 half-normal saline  #2. Urinary tract infection  urine cultures have Escherichia coli. Sensitivities pending.  #4 acute encephalopathy Due to UTI  #5. Hyperglycemia, monitor.  All the records are reviewed and case discussed with Care Management/Social Workerr. Management plans discussed with the patient, family and they are in agreement.  CODE STATUS: DNR  TOTAL TIME TAKING CARE OF THIS PATIENT: 35 minutes.   Possible discharge  to home with hospice or hospice home on comfort measures as per family meeting later with palliative care.  Sergey Ishler,  Molinda Bailiff M.D on 09/14/2016   Between 7am to 6pm - Pager - [phone]  After 6pm go to [email or web address] - password [email or web address]  [facility] [place] [facility]  Office  [phone]  CC: Primary care physician; Mickey Farber, MD  Note: This dictation was prepared with Dragon dictation along with smaller phrase technology. Any transcriptional errors that result from this process are unintentional.

## 2016-09-14 NOTE — Discharge Summary (Signed)
[facility] - [place] at [facility]   PATIENT NAME: Holly York    MR#:  [id number]  DATE OF BIRTH:  September 27, 1936  DATE OF ADMISSION:  09/12/2016 ADMITTING PHYSICIAN: Katharina Caperima Vaickute, MD  DATE OF DISCHARGE: No discharge date for patient encounter.  PRIMARY CARE PHYSICIAN: THIES, DAVID, MD   ADMISSION DIAGNOSIS:  Acute renal failure (ARF) (HCC) [N17.9] Sepsis, due to unspecified organism (HCC) [A41.9] Urinary tract infection with hematuria, site unspecified [N39.0, R31.9] Acute renal failure, unspecified acute renal failure type (HCC) [N17.9] Altered mental status, unspecified altered mental status type [R41.82]  DISCHARGE DIAGNOSIS:  Principal Problem:   Acute renal failure (ARF) (HCC) Active Problems:   Sepsis (HCC)   UTI (urinary tract infection)   Hypotension   Encephalopathy acute   Hyperglycemia   SECONDARY DIAGNOSIS:   Past Medical History:  Diagnosis Date  . Anxiety   . Heart attack   . Hypercholesterolemia   . Stroke [facility](HCC)      ADMITTING HISTORY  HISTORY OF PRESENT ILLNESS: Holly York  is a 80 y.o. female with a known history of Anxiety, coronary artery disease, hyperlipidemia, stroke, due to brain aneurysm repair in 1988 in 2010, open heart surgery, MRSA infections in the past, who presents to the hospital with complaints of altered mental status, confusion. According to patient's family, she was noted to be confused yesterday. Talking out of head. She was walking and was fine on Sunday, few days ago. Today, however, she stopped talking and was sent to emergency room due to concerns of possible stroke. In emergency room, her labs revealed acute renal failure with creatinine of 4.6, urinalysis was concerning for urinary tract infection. Patient was hypotensive with systolic blood pressure in 90s, tachycardic to 110. Due to concerns of sepsis due to urinary tract infection and acute renal failure, hospitalist services were contacted for  admission. The patient is nonverbal, not able to review systems  HOSPITAL COURSE:    #1. Acute renal failure With hypernatremia #2. Urinary tract infection, E coli #4 Acute encephalopathy #5. Hyperglycemia  Patient was admitted onto medical floor and treated aggressively. Started on IV antibiotics and IV fluids. Input and output closely monitored with daily weight. Seen by nephrology. Patient developed hypernatremia and switch to D5 half-normal saline. Urine cultures have returned with Escherichia coli.  Due to no significant improvement family at this point has decided to transfer patient to hospice home with comfort measures for end-of-life care.  Discussed with palliative care.  CONSULTS OBTAINED:  Treatment Team:  Lamont DowdySarath Kolluru, MD  DRUG ALLERGIES:  No Known Allergies  DISCHARGE MEDICATIONS:   Current Discharge Medication List    CONTINUE these medications which have NOT CHANGED   Details  citalopram (CELEXA) 20 MG tablet Take 20 mg by mouth daily.    oxyCODONE (OXY IR/ROXICODONE) 5 MG immediate release tablet Take 1 tablet (5 mg total) by mouth every 4 (four) hours as needed for moderate pain. Qty: 15 tablet, Refills: 0      STOP taking these medications     aspirin 81 MG tablet      Cholecalciferol (VITAMIN D3) 5000 UNITS CAPS      cyanocobalamin 1000 MCG tablet      folic acid (FOLVITE) 800 MCG tablet      Multiple Vitamins-Minerals (MULTIVITAMIN WITH MINERALS) tablet      pyridOXINE (VITAMIN B-6) 100 MG tablet      simvastatin (ZOCOR) 40 MG tablet      valACYclovir (VALTREX) 500 MG tablet  Today   VITAL SIGNS:  Blood pressure (!) 147/98, pulse (!) 110, temperature 98.6 F (37 C), resp. rate (!) 21, height 5\' 1"  (1.549 m), weight 64.9 kg (143 lb 1.6 oz), SpO2 91 %.  I/O:   Intake/Output Summary (Last 24 hours) at 09/14/16 1256 Last data filed at 09/14/16 1223  Gross per 24 hour  Intake           1947.5 ml  Output                0 ml   Net           1947.5 ml    PHYSICAL EXAMINATION:  Physical Exam  GENERAL:  80 y.o.-year-old patient lying in the bed with no acute distress. LUNGS: Coarse breath sounds CARDIOVASCULAR: S1, S2 normal. No murmurs, rubs, or gallops.  ABDOMEN: Soft, non-tender, non-distended. NEUROLOGIC: Moves all 4 extremities. PSYCHIATRIC: The patient is drowzy  DATA REVIEW:   CBC  Recent Labs Lab 09/13/16 0415  WBC 10.6  HGB 12.0  HCT 36.4  PLT 241    Chemistries   Recent Labs Lab 09/14/16 0446  NA 148*  K 3.7  CL 117*  CO2 18*  GLUCOSE 100*  BUN 46*  CREATININE 3.14*  CALCIUM 8.7*    Cardiac Enzymes  Recent Labs Lab 09/13/16 0415  TROPONINI 0.03*    Microbiology Results  Results for orders placed or performed during the hospital encounter of 09/12/16  Urine culture     Status: Abnormal   Collection Time: 09/12/16  9:33 AM  Result Value Ref Range Status   Specimen Description URINE, RANDOM  Final   Special Requests NONE  Final   Culture >=100,000 COLONIES/mL ESCHERICHIA COLI (A)  Final   Report Status 09/14/2016 FINAL  Final   Organism ID, Bacteria ESCHERICHIA COLI (A)  Final      Susceptibility   Escherichia coli - MIC*    AMPICILLIN 4 SENSITIVE Sensitive     CEFAZOLIN <=4 SENSITIVE Sensitive     CEFTRIAXONE <=1 SENSITIVE Sensitive     CIPROFLOXACIN <=0.25 SENSITIVE Sensitive     GENTAMICIN <=1 SENSITIVE Sensitive     IMIPENEM <=0.25 SENSITIVE Sensitive     NITROFURANTOIN 32 SENSITIVE Sensitive     TRIMETH/SULFA <=20 SENSITIVE Sensitive     AMPICILLIN/SULBACTAM 4 SENSITIVE Sensitive     PIP/TAZO <=4 SENSITIVE Sensitive     Extended ESBL NEGATIVE Sensitive     * >=100,000 COLONIES/mL ESCHERICHIA COLI  Culture, blood (routine x 2)     Status: None (Preliminary result)   Collection Time: 09/12/16 11:11 AM  Result Value Ref Range Status   Specimen Description BLOOD LEFT ARM  Final   Special Requests BOTTLES DRAWN AEROBIC AND ANAEROBIC 7CC  Final   Culture NO  GROWTH 2 DAYS  Final   Report Status PENDING  Incomplete  Culture, blood (routine x 2)     Status: None (Preliminary result)   Collection Time: 09/12/16 11:11 AM  Result Value Ref Range Status   Specimen Description BLOOD RIGHT HAND  Final   Special Requests BOTTLES DRAWN AEROBIC AND ANAEROBIC 4CC  Final   Culture  Setup Time   Final    Organism ID to follow GRAM POSITIVE COCCI AEROBIC BOTTLE ONLY CRITICAL RESULT CALLED TO, READ BACK BY AND VERIFIED WITH: SHEEMA HALLAJI 09/13/16 1433 SGD    Culture GRAM POSITIVE COCCI  Final   Report Status PENDING  Incomplete  Blood Culture ID Panel (Reflexed)     Status:  Abnormal   Collection Time: 09/12/16 11:11 AM  Result Value Ref Range Status   Enterococcus species NOT DETECTED NOT DETECTED Final   Listeria monocytogenes NOT DETECTED NOT DETECTED Final   Staphylococcus species DETECTED (A) NOT DETECTED Final    Comment: CRITICAL RESULT CALLED TO, READ BACK BY AND VERIFIED WITH: SHEEMA HALLAJI 09/13/16 1433 SGD    Staphylococcus aureus NOT DETECTED NOT DETECTED Final   Methicillin resistance DETECTED (A) NOT DETECTED Final    Comment: CRITICAL RESULT CALLED TO, READ BACK BY AND VERIFIED WITH: SHEEMA HALLAJI 09/13/16 1433 SGD    Streptococcus species NOT DETECTED NOT DETECTED Final   Streptococcus agalactiae NOT DETECTED NOT DETECTED Final   Streptococcus pneumoniae NOT DETECTED NOT DETECTED Final   Streptococcus pyogenes NOT DETECTED NOT DETECTED Final   Acinetobacter baumannii NOT DETECTED NOT DETECTED Final   Enterobacteriaceae species NOT DETECTED NOT DETECTED Final   Enterobacter cloacae complex NOT DETECTED NOT DETECTED Final   Escherichia coli NOT DETECTED NOT DETECTED Final   Klebsiella oxytoca NOT DETECTED NOT DETECTED Final   Klebsiella pneumoniae NOT DETECTED NOT DETECTED Final   Proteus species NOT DETECTED NOT DETECTED Final   Serratia marcescens NOT DETECTED NOT DETECTED Final   Haemophilus influenzae NOT DETECTED NOT  DETECTED Final   Neisseria meningitidis NOT DETECTED NOT DETECTED Final   Pseudomonas aeruginosa NOT DETECTED NOT DETECTED Final   Candida albicans NOT DETECTED NOT DETECTED Final   Candida glabrata NOT DETECTED NOT DETECTED Final   Candida krusei NOT DETECTED NOT DETECTED Final   Candida parapsilosis NOT DETECTED NOT DETECTED Final   Candida tropicalis NOT DETECTED NOT DETECTED Final  MRSA PCR Screening     Status: None   Collection Time: 09/12/16  4:15 PM  Result Value Ref Range Status   MRSA by PCR NEGATIVE NEGATIVE Final    Comment:        The GeneXpert MRSA Assay (FDA approved for NASAL specimens only), is one component of a comprehensive MRSA colonization surveillance program. It is not intended to diagnose MRSA infection nor to guide or monitor treatment for MRSA infections.     RADIOLOGY:  US Renal  Result Date: 09/12/2016 CLINICAL DATA:  Acute renal failure. EXAM: RENAL / URINARY TRACT ULTRASOUND COMPLETE COMPARISON:  None. FINDINGS: Right Kidney: Length: 10.1 cm. Normal renal cortical thickness and echogenicity without focal lesions or hydronephrosis. Left Kidney: Length: 10.8 cm. Normal renal cortical thickness and echogenicity without focal lesions or hydronephrosis. Bladder: Normal IMPRESSION: Normal renal ultrasound examination.  No hydronephrosis. Electronically Signed   By: Rudie Meyer M.D.   On: 09/12/2016 14:14   Dg Chest Port 1 View  Result Date: 09/14/2016 CLINICAL DATA:  Hypoxia EXAM: PORTABLE CHEST 1 VIEW COMPARISON:  06/01/2016 FINDINGS: Cardiomediastinal silhouette is stable. Postsurgical changes right hilum again noted. Mild elevation of the right hemidiaphragm. There is right basilar atelectasis or infiltrate. No pulmonary edema. Stable left basilar scarring. IMPRESSION: No pulmonary edema. Right basilar atelectasis or infiltrate. Stable left basilar scarring. Electronically Signed   By: Natasha Mead M.D.   On: 09/14/2016 10:02    Follow up with PCP in 1  week.  Management plans discussed with the patient, family and they are in agreement.  CODE STATUS:     Code Status Orders        Start     Ordered   09/12/16 1543  Do not attempt resuscitation (DNR)  Continuous    Question Answer Comment  In the event of cardiac or respiratory  ARREST Do not call a "code blue"   In the event of cardiac or respiratory ARREST Do not perform Intubation, CPR, defibrillation or ACLS   In the event of cardiac or respiratory ARREST Use medication by any route, position, wound care, and other measures to relive pain and suffering. May use oxygen, suction and manual treatment of airway obstruction as needed for comfort.      09/12/16 1542    Code Status History    Date Active Date Inactive Code Status Order ID Comments User Context   06/01/2016  5:16 PM 06/03/2016  8:47 PM DNR [id number]  Enedina Finner, MD Inpatient   05/29/2015  2:06 AM 05/31/2015  7:11 PM Full Code [id number]  Crissie Figures, MD Inpatient    Advance Directive Documentation   Flowsheet Row Most Recent Value  Type of Advance Directive  Out of facility DNR (pink MOST or yellow form)  Pre-existing out of facility DNR order (yellow form or pink MOST form)  Yellow form placed in chart (order not valid for inpatient use)  "MOST" Form in Place?  No data      TOTAL TIME TAKING CARE OF THIS PATIENT ON DAY OF DISCHARGE: more than 30 minutes.  Milagros Loll R M.D on 09/14/2016 at 12:56 PM  Between 7am to 6pm - Pager - [phone]  After 6pm go to [email or web address] - password [email or web address] [facility]  [facility] [place] [facility]  Office  [phone]  CC: Primary care physician; Mickey Farber, MD  Note: This dictation was prepared with Dragon dictation along with smaller phrase technology. Any transcriptional errors that result from this process are unintentional.

## 2016-09-14 NOTE — Progress Notes (Signed)
[facility]  ROUNDING NOTE   Subjective:   Opening eyes, more alert this morning.   Creatinine 3.14 (4.61) CO2 18  NS at 75  Urine culture E. Coli  On ceftriaxone  Objective:  Vital signs in last 24 hours:  Temp:  [98.1 F (36.7 C)-99.5 F (37.5 C)] 98.1 F (36.7 C) (10/26 0934) Pulse Rate:  [98-115] 100 (10/26 0934) Resp:  [19-20] 20 (10/26 0619) BP: (96-190)/(66-87) 157/87 (10/26 0934) SpO2:  [85 %-96 %] 96 % (10/26 0939) Weight:  [64.9 kg (143 lb 1.6 oz)] 64.9 kg (143 lb 1.6 oz) (10/26 0500)  Weight change: -6.26 kg (-13 lb 12.8 oz) Filed Weights   09/12/16 1531 09/13/16 0425 09/14/16 0500  Weight: 68 kg (150 lb) 68.3 kg (150 lb 8 oz) 64.9 kg (143 lb 1.6 oz)    Intake/Output: I/O last 3 completed shifts: In: 2793.8 [I.V.:2743.8; IV Piggyback:50] Out: 0    Intake/Output this shift:  Total I/O In: 402.5 [I.V.:402.5] Out: -   Physical Exam: General: NAD, elderly, frail  Head: Normocephalic, atraumatic. Dry oral mucosal membranes  Eyes: Anicteric, PERRL  Neck: Supple, trachea midline  Lungs:  Clear to auscultation  Heart: Regular rate and rhythm  Abdomen:  Soft, nontender,   Extremities:  no peripheral edema.  Neurologic: Not following commands  Skin: No lesions       Basic Metabolic Panel:  Recent Labs Lab 09/12/16 0933 09/13/16 0415 09/14/16 0446  NA 142 146* 148*  K 4.1 3.9 3.7  CL 107 115* 117*  CO2 22 18* 18*  GLUCOSE 126* 111* 100*  BUN 49* 51* 46*  CREATININE 4.60* 4.61* 3.14*  CALCIUM 9.5 8.4* 8.7*    Liver Function Tests: No results for input(s): AST, ALT, ALKPHOS, BILITOT, PROT, ALBUMIN in the last 168 hours. No results for input(s): LIPASE, AMYLASE in the last 168 hours. No results for input(s): AMMONIA in the last 168 hours.  CBC:  Recent Labs Lab 09/12/16 0933 09/13/16 0415  WBC 12.2* 10.6  HGB 13.2 12.0  HCT 39.2 36.4  MCV 87.0 88.9  PLT 230 241    Cardiac Enzymes:  Recent Labs Lab 09/12/16 0933  09/12/16 2139 09/13/16 0415  TROPONINI 0.03* 0.03* 0.03*    BNP: Invalid input(s): POCBNP  CBG:  Recent Labs Lab 09/13/16 0716 09/13/16 1133 09/13/16 1712 09/14/16 0744  GLUCAP 105* 107* 100* 94    Microbiology: Results for orders placed or performed during the hospital encounter of 09/12/16  Urine culture     Status: Abnormal (Preliminary result)   Collection Time: 09/12/16  9:33 AM  Result Value Ref Range Status   Specimen Description URINE, RANDOM  Final   Special Requests NONE  Final   Culture >=100,000 COLONIES/mL ESCHERICHIA COLI (A)  Final   Report Status PENDING  Incomplete  Culture, blood (routine x 2)     Status: None (Preliminary result)   Collection Time: 09/12/16 11:11 AM  Result Value Ref Range Status   Specimen Description BLOOD LEFT ARM  Final   Special Requests BOTTLES DRAWN AEROBIC AND ANAEROBIC 7CC  Final   Culture NO GROWTH 2 DAYS  Final   Report Status PENDING  Incomplete  Culture, blood (routine x 2)     Status: None (Preliminary result)   Collection Time: 09/12/16 11:11 AM  Result Value Ref Range Status   Specimen Description BLOOD RIGHT HAND  Final   Special Requests BOTTLES DRAWN AEROBIC AND ANAEROBIC 4CC  Final   Culture  Setup Time  Final    Organism ID to follow GRAM POSITIVE COCCI AEROBIC BOTTLE ONLY CRITICAL RESULT CALLED TO, READ BACK BY AND VERIFIED WITH: SHEEMA HALLAJI 09/13/16 1433 SGD    Culture GRAM POSITIVE COCCI  Final   Report Status PENDING  Incomplete  Blood Culture ID Panel (Reflexed)     Status: Abnormal   Collection Time: 09/12/16 11:11 AM  Result Value Ref Range Status   Enterococcus species NOT DETECTED NOT DETECTED Final   Listeria monocytogenes NOT DETECTED NOT DETECTED Final   Staphylococcus species DETECTED (A) NOT DETECTED Final    Comment: CRITICAL RESULT CALLED TO, READ BACK BY AND VERIFIED WITH: SHEEMA HALLAJI 09/13/16 1433 SGD    Staphylococcus aureus NOT DETECTED NOT DETECTED Final   Methicillin  resistance DETECTED (A) NOT DETECTED Final    Comment: CRITICAL RESULT CALLED TO, READ BACK BY AND VERIFIED WITH: SHEEMA HALLAJI 09/13/16 1433 SGD    Streptococcus species NOT DETECTED NOT DETECTED Final   Streptococcus agalactiae NOT DETECTED NOT DETECTED Final   Streptococcus pneumoniae NOT DETECTED NOT DETECTED Final   Streptococcus pyogenes NOT DETECTED NOT DETECTED Final   Acinetobacter baumannii NOT DETECTED NOT DETECTED Final   Enterobacteriaceae species NOT DETECTED NOT DETECTED Final   Enterobacter cloacae complex NOT DETECTED NOT DETECTED Final   Escherichia coli NOT DETECTED NOT DETECTED Final   Klebsiella oxytoca NOT DETECTED NOT DETECTED Final   Klebsiella pneumoniae NOT DETECTED NOT DETECTED Final   Proteus species NOT DETECTED NOT DETECTED Final   Serratia marcescens NOT DETECTED NOT DETECTED Final   Haemophilus influenzae NOT DETECTED NOT DETECTED Final   Neisseria meningitidis NOT DETECTED NOT DETECTED Final   Pseudomonas aeruginosa NOT DETECTED NOT DETECTED Final   Candida albicans NOT DETECTED NOT DETECTED Final   Candida glabrata NOT DETECTED NOT DETECTED Final   Candida krusei NOT DETECTED NOT DETECTED Final   Candida parapsilosis NOT DETECTED NOT DETECTED Final   Candida tropicalis NOT DETECTED NOT DETECTED Final  MRSA PCR Screening     Status: None   Collection Time: 09/12/16  4:15 PM  Result Value Ref Range Status   MRSA by PCR NEGATIVE NEGATIVE Final    Comment:        The GeneXpert MRSA Assay (FDA approved for NASAL specimens only), is one component of a comprehensive MRSA colonization surveillance program. It is not intended to diagnose MRSA infection nor to guide or monitor treatment for MRSA infections.     Coagulation Studies: No results for input(s): LABPROT, INR in the last 72 hours.  Urinalysis:  Recent Labs  09/12/16 0933  COLORURINE YELLOW*  LABSPEC 1.006  PHURINE 6.0  GLUCOSEU NEGATIVE  HGBUR 2+*  BILIRUBINUR NEGATIVE   KETONESUR NEGATIVE  PROTEINUR 100*  NITRITE NEGATIVE  LEUKOCYTESUR 3+*      Imaging: Ct Head Wo Contrast  Result Date: 09/12/2016 CLINICAL DATA:  Altered mental status. EXAM: CT HEAD WITHOUT CONTRAST TECHNIQUE: Contiguous axial images were obtained from the base of the skull through the vertex without intravenous contrast. COMPARISON:  06/01/2016 FINDINGS: Brain: Would infarcts in the right cerebellar hemisphere. Ventricular shunt noted in the right frontal region terminating in the mid right lateral ventricle or possibly third ventricle. Extensive chronic small vessel disease changes throughout the deep white matter. Old infarcts in the right frontal and posterior right parietal lobes. No acute infarction, hemorrhage or hydrocephalus. Vascular: No hyperdense vessel or unexpected calcification. Skull: Normal. Negative for fracture or focal lesion. Sinuses/Orbits: Visualized paranasal sinuses and mastoids clear. Orbital soft tissues  unremarkable. Other: None IMPRESSION: Areas of old infarcts in the right frontal, right parietal and right occipital lobes. Stable right ventricular shunt.  No hydrocephalus. Chronic small vessel ischemic changes, stable. No acute intracranial abnormality. Electronically Signed   By: Charlett NoseKevin  Dover M.D.   On: 09/12/2016 11:05   [place] Renal  Result Date: 09/12/2016 CLINICAL DATA:  Acute renal failure. EXAM: RENAL / URINARY TRACT ULTRASOUND COMPLETE COMPARISON:  None. FINDINGS: Right Kidney: Length: 10.1 cm. Normal renal cortical thickness and echogenicity without focal lesions or hydronephrosis. Left Kidney: Length: 10.8 cm. Normal renal cortical thickness and echogenicity without focal lesions or hydronephrosis. Bladder: Normal IMPRESSION: Normal renal ultrasound examination.  No hydronephrosis. Electronically Signed   By: Rudie MeyerP.  Gallerani M.D.   On: 09/12/2016 14:14     Medications:   . dextrose 5 % and 0.45% NaCl 75 mL/hr at 09/14/16 0922   . aspirin EC  81 mg Oral  Daily  . cefTRIAXone  1 g Intravenous Q24H  . citalopram  20 mg Oral Daily  . folic acid  500 mcg Oral Daily  . heparin  5,000 Units Subcutaneous Q8H  . multivitamin with minerals  1 tablet Oral Daily  . pyridOXINE  100 mg Oral Daily  . simvastatin  40 mg Oral QHS  . cyanocobalamin  100 mcg Oral Daily  . Vitamin D3  1 capsule Oral Daily   bisacodyl, ondansetron **OR** ondansetron (ZOFRAN) IV  Assessment/ Plan:  Ms. Holly York is a 80 y.o. white female  with CVA, hyperlipidemia, hypertension, coronary artery disease, anxiety , brain aneurysm who [facility]12[facility]   1. Acute Renal Failure: with baseline creatinine of 0.85. Acute renal failure from urinary tract infection, hypotension, sepsis and poor PO intake. Ultrasound without obstruction - due to hypernatremia and metabolic acidosis: change IV fluids to D5 1/2NS at 8175mL/hr - renally dose all medications - monitor urine output, renal function, volume status and electrolytes.   2. Urinary tract infection: E. Coli - empiric ceftriaxone  3. Hypotension and sepsis: blood pressure now elevated.    LOS: 2 Holly York 10/26/20179:55 AM

## 2016-09-14 NOTE — Clinical Social Work Note (Signed)
Clinical Social Work Assessment  Patient Details  Name: Holly York MRN: [id number] Date of Birth: 1936-08-10  Date of referral:  09/13/16               Reason for consult:  Facility Placement                Permission sought to share information with:  Family Supports, Customer service manager Permission granted to share information::  Yes, Verbal Permission Granted  Name::     Hursey,Colleen Daughter [phone]   Agency::  [place]  Relationship::     Contact Information:     Housing/Transportation Living arrangements for the past 2 months:  [place] of Information:  Adult Children Patient Interpreter Needed:  None Criminal Activity/Legal Involvement Pertinent to Current Situation/Hospitalization:  No - Comment as needed Significant Relationships:  Adult Children Lives with:  Facility Resident Do you feel safe going back to the place where you live?  Yes Need for family participation in patient care:  Yes (Comment) (Patient does not talk very much and has some confusion.)  Care giving concerns:  Patient's family would like patient to be comfort care and to go to [facility].   Social Worker assessment / plan:  Patient is from [facility] is not alert and oriented, MSW spoke to patient's family to complete assessment.  Patient's family stated she has had a hard couple of years and had a lot of medical issues.  Patient's family stated they would like patient to have only comfort care and would like her to go to [facility].  Palliative met with patient and her family and they have agreed to end of life care.  Patient's family are at bedside and are comfortable with decision made to have her go to hospice facility.  Employment status:  Retired Forensic scientist:  Medicare PT Recommendations:  Not assessed at this time [place] / Referral to community resources:     Patient/Family's  Response to care:  Patient's family are in agreement to having patient go to [facility].  Patient/Family's Understanding of and Emotional Response to Diagnosis, Current Treatment, and Prognosis:  Patient's family are content about having patient go to [facility].  Patient's family stated patient made her living will before she became sick and they were relieved to not have to make end of life decisions for her.  Emotional Assessment Appearance:  Appears stated age Attitude/Demeanor/Rapport:    Affect (typically observed):  Calm, Appropriate Orientation:    Alcohol / Substance use:  Not Applicable Psych involvement (Current and /or in the community):  No (Comment)  Discharge Needs  Concerns to be addressed:  No discharge needs identified Readmission within the last 30 days:  No Current discharge risk:  None Barriers to Discharge:  No Barriers Identified   Ross Ludwig 09/14/2016, 2:54 PM

## 2016-09-14 NOTE — Consult Note (Signed)
Consultation Note Date: 09/14/2016   Patient Name: Holly York  DOB: Jul 18, 1936  MRN: [id number]  Age / Sex: 80 y.o., female  PCP: Holly Kayser, MD Referring Physician: Hillary Bow, MD  Reason for Consultation: Establishing goals of care  HPI/Patient Profile: 80 y.o. female  with past medical history of stroke due to brain aneurysm, coronary artery disease, hyperlipidemia, coronary artery bypass graft, and anxiety admitted on 09/12/2016 with altered mental status and confusion. In ED, patient was found to be in acute renal failure with creatinine of 4.6. Hypotensive and tachycardic due to urosepsis. Nephrology consulted. Patient receiving IV hydration and antibiotics. Palliative medicine consultation for goals of care.   Clinical Assessment and Goals of Care: This NP met with patient's daughter and son at bedside Holly York and Holly York). Holly York has been living at [facility] for four months. Prior to that, she was living with Holly York at home. Her children provide a detailed description of her past and events leading up to her decline the past year. Her husband died 80 years ago and after his death, she became very independent and traveled all the time. She ran marathons and loved spin classes.   In the past 7 years, she has had a brain aneurysm, multiple strokes, open heart surgery, and falls. Holly York and Holly York have been told multiple times that she "wasn't going to make it." They have watched her decline and in pain for many years and wish for their mother to be comfortable and die with dignity. She would tell her children often that every day was a day closer to being with her husband again. Holly York is a spiritual individual and has never been afraid to die. Holly York was at bedside yesterday and daughter tells me that Holly York was staring off in the distance, reaching, and  smiling. Holly York believes her mother is already seeing her father in [place].    Her children are her HCPOA's and know her wishes. She would not want to be resuscitated, on life support, with a peg, or with life prolonging interventions long term. They wish to pursue comfort measures only and for fluids and antibiotics to be discontinued. They agree with a residential hospice facility. Discussed symptom management, trajectory, and expectations at end-of-life. Emotional support given.   HCPOA-daughter and son Holly York and Holly York)   SUMMARY OF RECOMMENDATIONS    DNR/DNI  Family wishes for comfort measures only. Discontinued labs, IVF, medications, and nursing interventions not aimed at comfort.   Symptom management--see below.  Oxygen discontinued per family request.  May give sips for comfort.   Residential hospice eligible. Social work consult placed.   Code Status/Advance Care Planning:  DNR   Symptom Management:   Morphine 0.49m IV q1h prn pain/dyspnea.  Robinul 0.2614mIV q4h prn secretions.  Ativan 0.14m37mV q4h prn anxiety/agitation.  Palliative Prophylaxis:   Aspiration, Delirium Protocol, Frequent Pain Assessment, Oral Care and Turn Reposition  Additional Recommendations (Limitations, Scope, Preferences):  Full Comfort Care  Psycho-social/Spiritual:   Desire for further Chaplaincy  support:no-Patient's priest came to bedside 10/25.  Additional Recommendations: Caregiving  Support/Resources  Prognosis:   < 2 weeks in the setting of acute on chronic renal failure, discontinuation of life-prolonging interventions (IVF and antibiotics), and severe functional and nutritional status decline.   Discharge Planning: Hospice facility      Primary Diagnoses: Present on Admission: **None**   I have reviewed the medical record, interviewed the patient and family, and examined the patient. The following aspects are pertinent.  Past Medical History:  Diagnosis Date  .  Anxiety   . Heart attack   . Hypercholesterolemia   . Stroke [facility])    Social History   Social History  . Marital status: Widowed    Spouse name: N/A  . Number of children: N/A  . Years of education: N/A   Social History Main Topics  . Smoking status: Never Smoker  . Smokeless tobacco: Never Used  . Alcohol use No  . Drug use: No  . Sexual activity: Not Currently   Other Topics Concern  . None   Social History Narrative  . None   Family History  Problem Relation Age of Onset  . Alcoholism Father   . Heart disease Brother    Scheduled Meds:   Continuous Infusions:   PRN Meds:.bisacodyl, glycopyrrolate, LORazepam, morphine injection, ondansetron **OR** ondansetron (ZOFRAN) IV Medications Prior to Admission:  Prior to Admission medications   Medication Sig Start Date End Date Taking? Authorizing Provider  aspirin 81 MG tablet Take 81 mg by mouth daily.   Yes Historical Provider, MD  Cholecalciferol (VITAMIN D3) 5000 UNITS CAPS Take by mouth daily.   Yes Historical Provider, MD  citalopram (CELEXA) 20 MG tablet Take 20 mg by mouth daily.   Yes Historical Provider, MD  cyanocobalamin 1000 MCG tablet Take 100 mcg by mouth daily.   Yes Historical Provider, MD  folic acid (FOLVITE) 062 MCG tablet Take 400 mcg by mouth daily.   Yes Historical Provider, MD  Multiple Vitamins-Minerals (MULTIVITAMIN WITH MINERALS) tablet Take 1 tablet by mouth daily.   Yes Historical Provider, MD  oxyCODONE (OXY IR/ROXICODONE) 5 MG immediate release tablet Take 1 tablet (5 mg total) by mouth every 4 (four) hours as needed for moderate pain. 06/03/16  Yes Lytle [place], MD  pyridOXINE (VITAMIN B-6) 100 MG tablet Take 100 mg by mouth daily.   Yes Historical Provider, MD  simvastatin (ZOCOR) 40 MG tablet Take 40 mg by mouth daily.   Yes Historical Provider, MD  valACYclovir (VALTREX) 500 MG tablet Take 1,000 mg by mouth 3 (three) times daily.   Yes Historical Provider, MD   No Known Allergies Review  of Systems  Unable to perform ROS: Mental status change   Physical Exam  Constitutional: She appears lethargic. She appears ill.  Eyes: Pupils are equal, round, and reactive to light.  Cardiovascular: Regular rhythm.  Exam reveals distant heart sounds.   Pulmonary/Chest: No accessory muscle usage. No tachypnea. She has rhonchi.  Abdominal: Soft. Bowel sounds are decreased. There is tenderness.  Neurological: She appears lethargic.  Skin: Skin is warm and dry. There is pallor.  Psychiatric: She is inattentive.  Nursing note and vitals reviewed.  Vital Signs: BP (!) 147/98 (BP Location: Right Arm)   Pulse (!) 110   Temp 98.6 F (37 C)   Resp (!) 21   Ht 5' 1"  (1.549 m)   Wt 64.9 kg (143 lb 1.6 oz)   SpO2 91%   BMI 27.04 kg/m  Pain Assessment:  Faces     SpO2: SpO2: 91 % O2 Device:SpO2: 91 % O2 Flow Rate: .O2 Flow Rate (L/min): 2.5 L/min  IO: Intake/output summary:   Intake/Output Summary (Last 24 hours) at 09/14/16 1350 Last data filed at 09/14/16 1223  Gross per 24 hour  Intake           1947.5 ml  Output                0 ml  Net           1947.5 ml    LBM:   Baseline Weight: Weight: 71.2 kg (156 lb 14.4 oz) Most recent weight: Weight: 64.9 kg (143 lb 1.6 oz)     Palliative Assessment/Data: PPS 10%   Flowsheet Rows   Flowsheet Row Most Recent Value  Intake Tab  Referral Department  Hospitalist  Unit at Time of Referral  Cardiac/Telemetry Unit  Palliative Care Primary Diagnosis  Sepsis/Infectious Disease  Palliative Care Type  New Palliative care  Date of Admission  09/12/16  Date first seen by Palliative Care  09/14/16  Clinical Assessment  Palliative Performance Scale Score  10%  Psychosocial & Spiritual Assessment  Palliative Care Outcomes  Patient/Family meeting held?  Yes  Who was at the meeting?  daughter and son  Palliative Care Outcomes  Clarified goals of care, Provided end of life care assistance, Provided psychosocial or spiritual support,  Transitioned to hospice, Changed to focus on comfort      Time In: 1130 Time Out: 1240 Time Total: 63mn Greater than 50%  of this time was spent counseling and coordinating care related to the above assessment and plan.  Signed by:  MIhor Dow FNP-C Palliative Medicine Team  Phone: [phone]: [phone]  Please contact Palliative Medicine Team phone at [phone] questions and concerns.  For individual provider: See AShea Evans

## 2016-09-14 NOTE — Care Management (Signed)
Patient has been evaluated by palliative care and plan is for transfer to the Hospice Home

## 2016-09-14 NOTE — Clinical Social Work Note (Signed)
Palliative met with patient and her family and they have decided to have patient go to [facility].  MSW contacted [facility] and made referral.  [place] [facility] evaluated patient and are able to accept her today.  MSW informed patient's family that [place] [facility] will meet with them to make arrangements for placement.  MSW updated [facility], and informed them that patient is discharging to [facility] today.  Patient to be d/c'ed today to [facility].  Patient's family agreeable to plans will transport via ems.  Hospice nurse liaison will call and give report.   Evette Cristal, MSW Mon-Fri 8a-4:30p [phone]

## 2016-09-14 NOTE — Progress Notes (Signed)
Patient made comfortable. EMS here. Packet was given to the EMS.  Sharin MonsMandi D Brown

## 2016-09-14 NOTE — Progress Notes (Signed)
Patient placed on comfort measures. o2 removed per families request, cardiac monitor removed and ivf stopped per md orders. Will continue to monitor

## 2016-09-15 LAB — T3, FREE: T3, Free: 1.5 pg/mL — ABNORMAL LOW (ref 2.0–4.4)

## 2016-09-15 LAB — CULTURE, BLOOD (ROUTINE X 2)

## 2016-09-17 LAB — CULTURE, BLOOD (ROUTINE X 2): CULTURE: NO GROWTH

## 2016-09-20 DEATH — deceased

## 2017-11-23 IMAGING — CT CT HEAD W/O CM
3 series · 14 of 46 positions shown, 16 images · non-contrast
Comparison: 02/10/2012

CLINICAL DATA: Recent falls and increasing confusion

EXAM:
CT HEAD WITHOUT CONTRAST
TECHNIQUE: Contiguous axial images were obtained from the base of the skull
through the vertex without intravenous contrast.

[Series 2: head wo · axial · 0.41mm/px · z∈[+585,+705]mm · 8 of 29 slices shown, 10 images]
[im 3/29  brain]
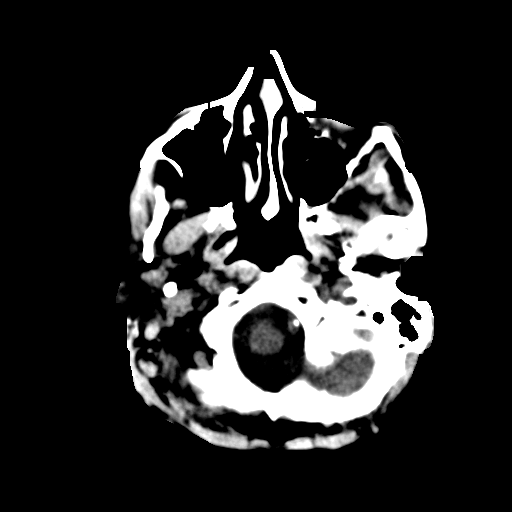
[im 3/29  bone]
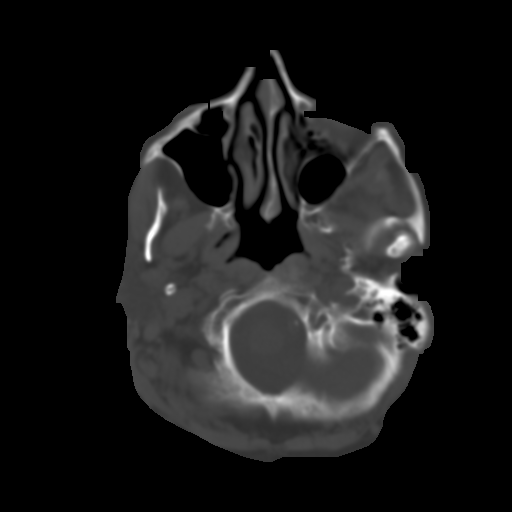
[im 7/29  brain]
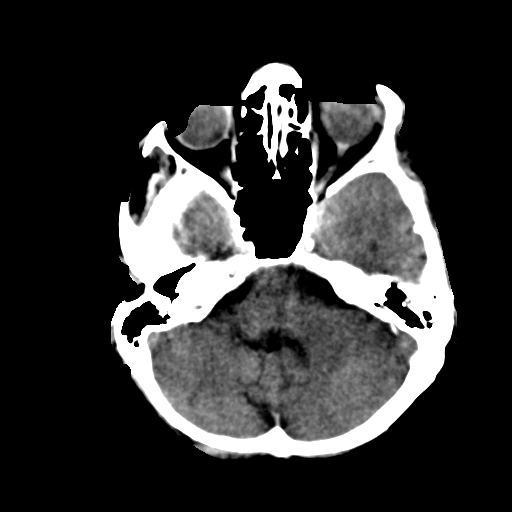
[im 10/29  brain]
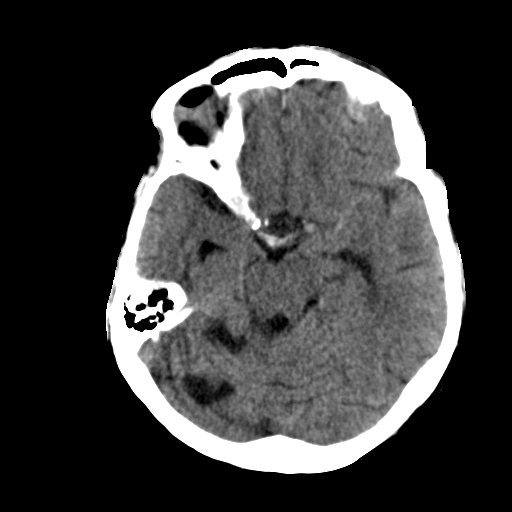
[im 13/29  brain]
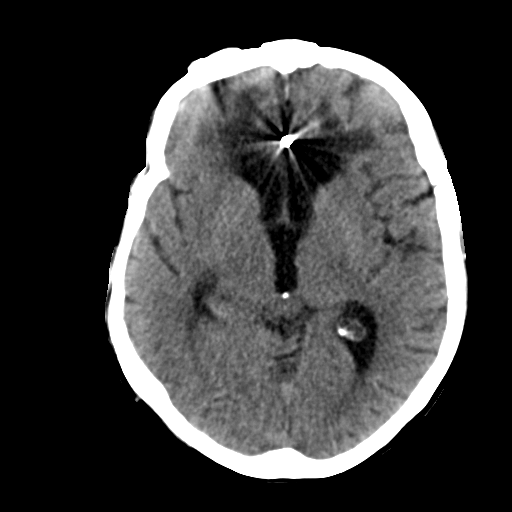
[im 17/29  brain]
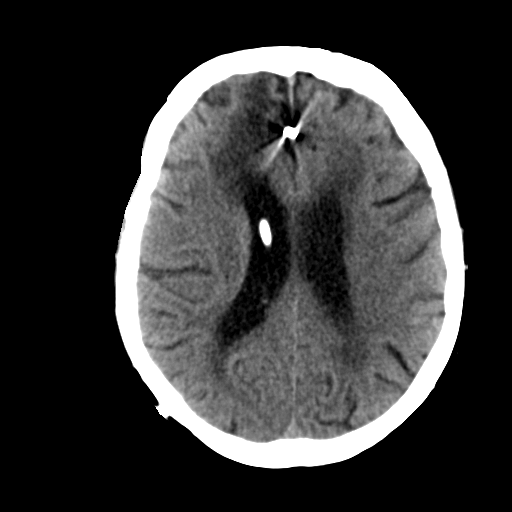
[im 17/29  bone]
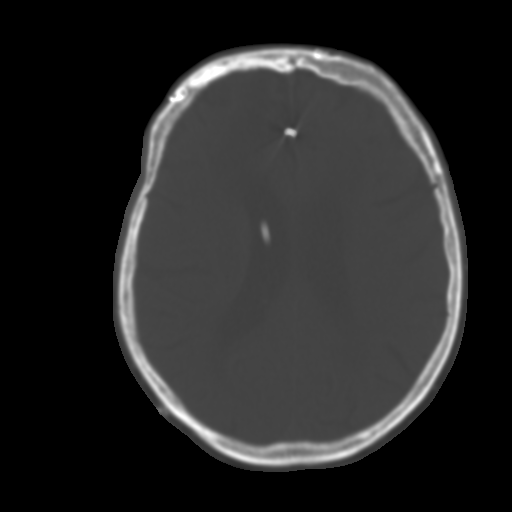
[im 20/29  brain]
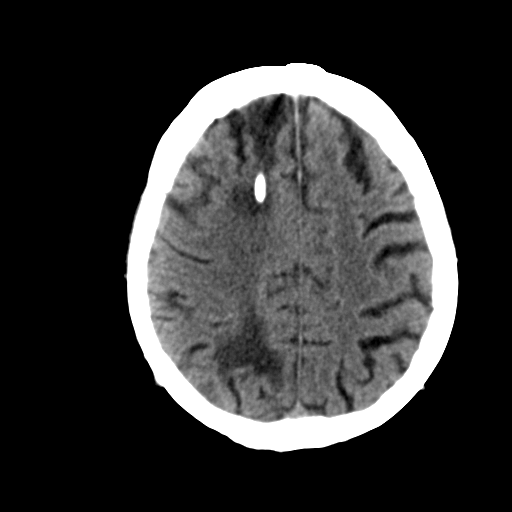
[im 23/29  brain]
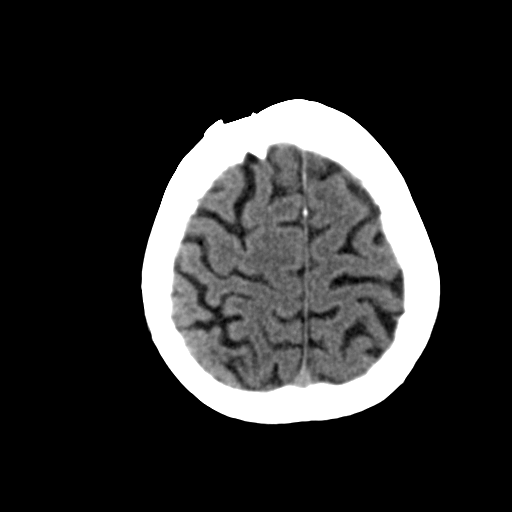
[im 27/29  brain]
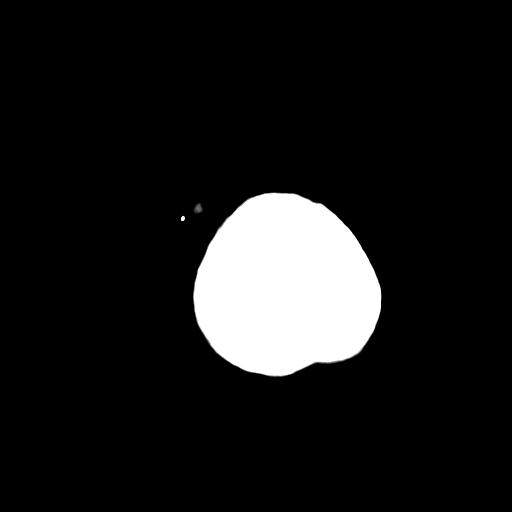

[Series 5: sagittal soft tissue · sagittal · 0.30mm/px · 3 of 66 slices shown]
[im 22/66  brain]
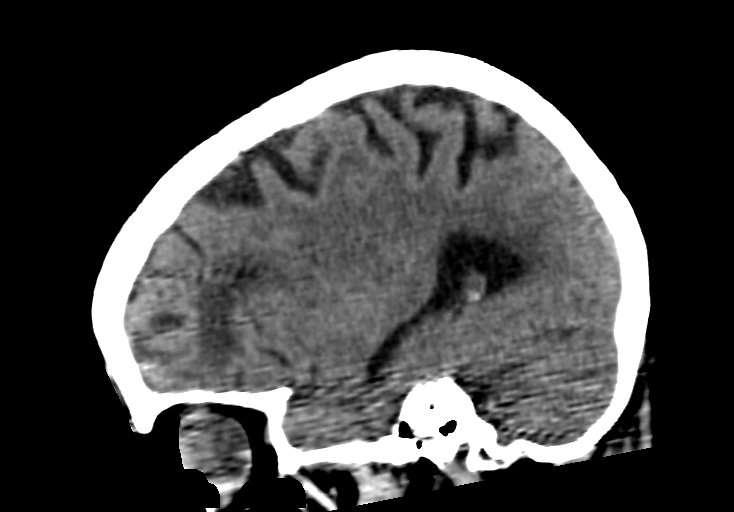
[im 33/66  brain]
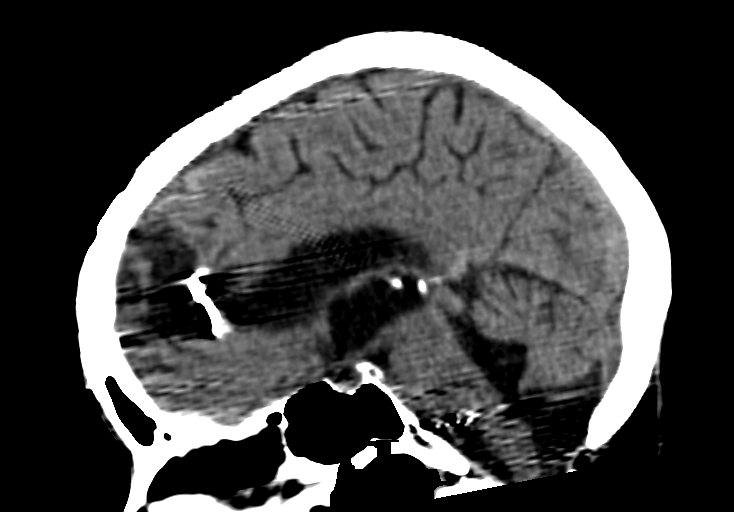
[im 44/66  brain]
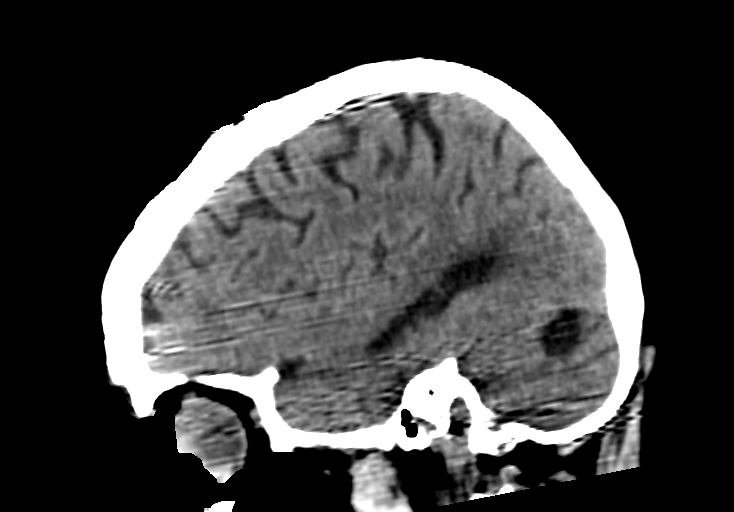

[Series 6: coronal soft tissue. · coronal · 0.27mm/px · 3 of 66 slices shown]
[im 22/66  brain]
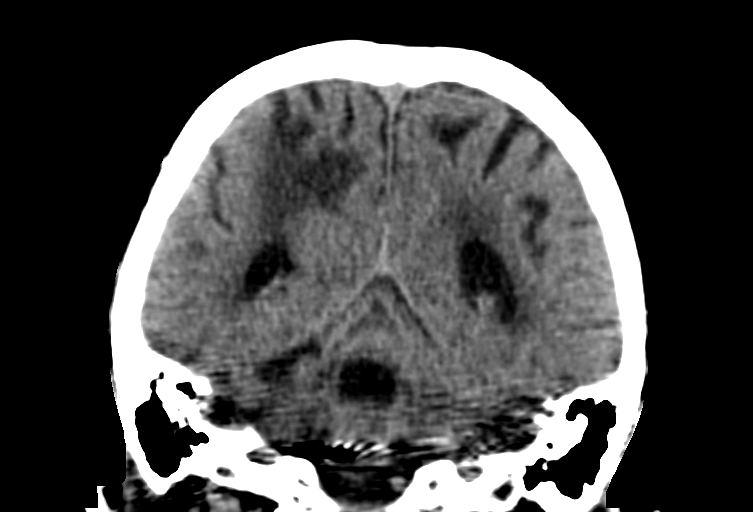
[im 29/66  brain]
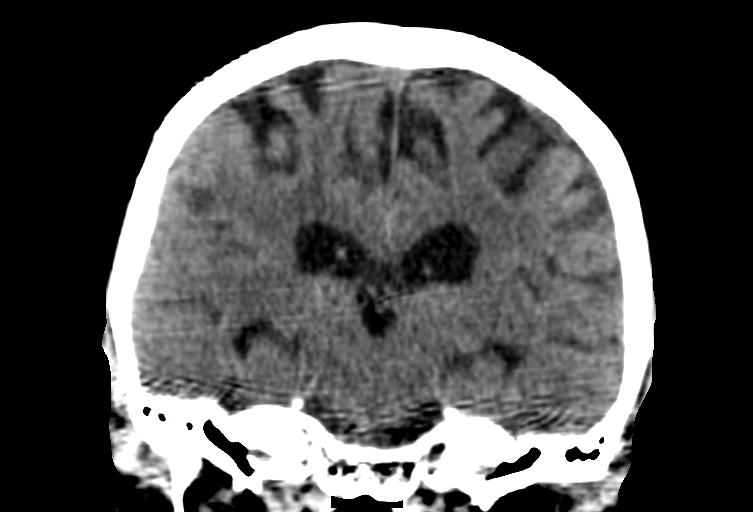
[im 37/66  brain]
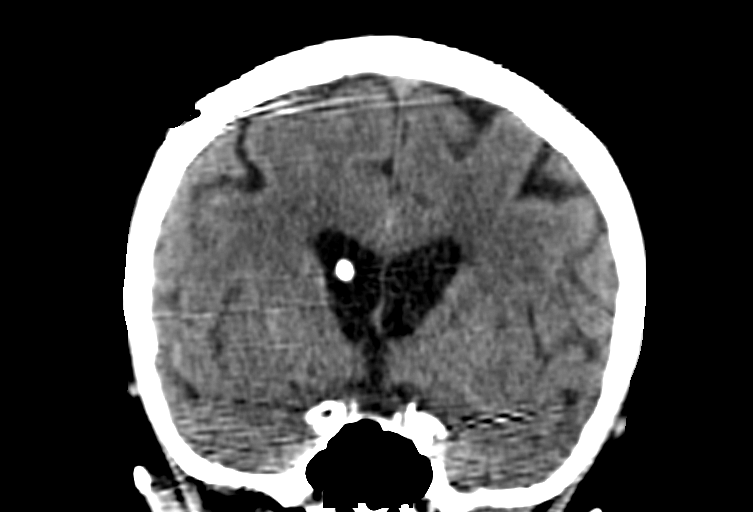

[14 of 46 positions shown; findings below may reference images not displayed]

FINDINGS: Bony calvarium is intact. There are changes consistent with prior
embolization as well as prior aneurysm clipping along the falx
anteriorly. A ventriculostomy catheter is again seen on the right.
Frontal encephalomalacia changes are again noted and stable. No
findings to suggest acute hemorrhage, acute infarction or
space-occupying mass lesion are noted. Prior cerebellar infarcts are
noted on the right as well as changes of prior ischemia in the right
posterior parietal lobe near the vertex.
IMPRESSION: Chronic ischemic changes and prior surgical changes which are stable
from the prior exam.

No acute abnormality noted.
